# Patient Record
Sex: Female | Born: 1980 | Race: Black or African American | Hispanic: No | Marital: Single | Smoking: Never smoker
Health system: Southern US, Community
[De-identification: ages and names within clinical notes are randomized; demographics above are authoritative.]

## PROBLEM LIST (undated history)

## (undated) DIAGNOSIS — F419 Anxiety disorder, unspecified: Secondary | ICD-10-CM

## (undated) DIAGNOSIS — G43909 Migraine, unspecified, not intractable, without status migrainosus: Secondary | ICD-10-CM

## (undated) HISTORY — DX: Anxiety disorder, unspecified: F41.9

---

## 2017-03-12 ENCOUNTER — Encounter (HOSPITAL_COMMUNITY): Payer: Self-pay | Admitting: Emergency Medicine

## 2017-03-12 ENCOUNTER — Ambulatory Visit (HOSPITAL_COMMUNITY)
Admission: EM | Admit: 2017-03-12 | Discharge: 2017-03-12 | Disposition: A | Discharge status: Home / Self Care | Payer: Self-pay | Attending: Internal Medicine | Admitting: Internal Medicine

## 2017-03-12 DIAGNOSIS — K047 Periapical abscess without sinus: Secondary | ICD-10-CM

## 2017-03-12 DIAGNOSIS — K0889 Other specified disorders of teeth and supporting structures: Secondary | ICD-10-CM

## 2017-03-12 MED ORDER — HYDROCODONE-ACETAMINOPHEN 5-325 MG PO TABS: 1.0000 | ORAL_TABLET | ORAL | 0 refills | Status: DC | PRN

## 2017-03-12 MED ORDER — AMOXICILLIN 500 MG PO CAPS: 1000.0000 mg | ORAL_CAPSULE | Freq: Two times a day (BID) | ORAL | 0 refills | Status: DC

## 2017-03-12 NOTE — ED Provider Notes (Signed)
CSN: [id number]     Arrival date & time 03/12/17  1841 History   First MD Initiated Contact with Patient 03/12/17 1947     Chief Complaint  Patient presents with  . Dental Pain   (Consider location/radiation/quality/duration/timing/severity/associated sxs/prior Treatment) 36 year old female complaining of toothache to the left upper bicuspid. She states it is formed an abscess.      History reviewed. No pertinent past medical history. History reviewed. No pertinent surgical history. No family history on file. Social History  Substance Use Topics  . Smoking status: Never Smoker  . Smokeless tobacco: Never Used  . Alcohol use Yes   OB History    No data available     Review of Systems  Constitutional: Negative.   HENT: Positive for dental problem.   Neurological: Negative.   All other systems reviewed and are negative.   Allergies  Patient has no known allergies.  Home Medications   Prior to Admission medications   Medication Sig Start Date End Date Taking? Authorizing Provider  amoxicillin (AMOXIL) 500 MG capsule Take 2 capsules (1,000 mg total) by mouth 2 (two) times daily. 03/12/17   Hayden Rasmussen, NP  HYDROcodone-acetaminophen (NORCO/VICODIN) 5-325 MG tablet Take 1 tablet by mouth every 4 (four) hours as needed. 03/12/17   Hayden Rasmussen, NP   Meds Ordered and Administered this Visit  Medications - No data to display  BP 103/67   Pulse 76   Temp 98.2 F (36.8 C) (Oral)   Resp 16   Ht 5\' 5"  (1.651 m)   Wt 212 lb (96.2 kg)   LMP 02/13/2017   SpO2 100%   BMI 35.28 kg/m  No data found.   Physical Exam  Constitutional: She is oriented to person, place, and time. She appears well-developed and well-nourished. No distress.  HENT:  At the time of examining the involved tooth and abscess the patient uses her index finger and retracts the upper lip and cheek. During this maneuver the abscess over the gingiva of the left bicuspid spontaneously ruptures and copious  amount of pus drains from this abscess. There is erythema along the gingiva. The  obvious abscess above. Positive dental tenderness.   Eyes: EOM are normal.  Neck: Neck supple.  Cardiovascular: Normal rate.   Neurological: She is oriented to person, place, and time.  Skin: Skin is warm and dry.  Psychiatric: She has a normal mood and affect.  Nursing note and vitals reviewed.   Urgent Care Course     Procedures (including critical care time)  Labs Review Labs Reviewed - No data to display  Imaging Review No results found.   Visual Acuity Review  Right Eye Distance:   Left Eye Distance:   Bilateral Distance:    Right Eye Near:   Left Eye Near:    Bilateral Near:         MDM   1. Pain, dental   2. Dental abscess    Frequent mouth rinses with warm salty water. Take the medication as directed. May also take ibuprofen 600 mg every 6 hours as needed. Do not take more than this. Meds ordered this encounter  Medications  . amoxicillin (AMOXIL) 500 MG capsule    Sig: Take 2 capsules (1,000 mg total) by mouth 2 (two) times daily.    Dispense:  40 capsule    Refill:  0    Order Specific Question:   Supervising Provider    Answer:   Eustace Moore [[id number]]  .  HYDROcodone-acetaminophen (NORCO/VICODIN) 5-325 MG tablet    Sig: Take 1 tablet by mouth every 4 (four) hours as needed.    Dispense:  15 tablet    Refill:  0    Order Specific Question:   Supervising Provider    Answer:   Eustace MooreMURRAY, LAURA W [[id number]]       Hayden RasmussenMabe, Jamerica Snavely, NP 03/12/17 2007

## 2017-03-12 NOTE — ED Triage Notes (Addendum)
PT has left upper dental abscess. Long term problem with affected tooth. Swelling for 1 week. Swelling got significantly worse within last 24 hours. PT took 2 prescription strength ibuprofen at 1600

## 2017-03-12 NOTE — Discharge Instructions (Signed)
Frequent mouth rinses with warm salty water. Take the medication as directed. May also take ibuprofen 600 mg every 6 hours as needed. Do not take more than this.

## 2017-11-09 ENCOUNTER — Encounter (HOSPITAL_COMMUNITY): Payer: Self-pay | Admitting: *Deleted

## 2017-11-09 ENCOUNTER — Other Ambulatory Visit: Payer: Self-pay

## 2017-11-09 ENCOUNTER — Emergency Department (HOSPITAL_COMMUNITY)
Admission: EM | Admit: 2017-11-09 | Discharge: 2017-11-10 | Disposition: A | Discharge status: Home / Self Care | Payer: Self-pay | Attending: Emergency Medicine | Admitting: Emergency Medicine

## 2017-11-09 DIAGNOSIS — G43009 Migraine without aura, not intractable, without status migrainosus: Secondary | ICD-10-CM

## 2017-11-09 DIAGNOSIS — G43909 Migraine, unspecified, not intractable, without status migrainosus: Secondary | ICD-10-CM | POA: Insufficient documentation

## 2017-11-09 MED ORDER — KETOROLAC TROMETHAMINE 15 MG/ML IJ SOLN
15.0000 mg | Freq: Once | INTRAMUSCULAR | Status: AC
  Administered 2017-11-09: 15 mg via INTRAVENOUS
  Filled 2017-11-09: qty 1

## 2017-11-09 MED ORDER — DIPHENHYDRAMINE HCL 50 MG/ML IJ SOLN
12.5000 mg | Freq: Once | INTRAMUSCULAR | Status: AC
  Administered 2017-11-09: 12.5 mg via INTRAVENOUS
  Filled 2017-11-09: qty 1

## 2017-11-09 MED ORDER — METOCLOPRAMIDE HCL 5 MG/ML IJ SOLN
10.0000 mg | Freq: Once | INTRAMUSCULAR | Status: AC
  Administered 2017-11-09: 10 mg via INTRAVENOUS
  Filled 2017-11-09: qty 2

## 2017-11-09 NOTE — ED Triage Notes (Signed)
Headache since Monday with nausea no vomiting or diarrhea.   She has taken tylenol and I has helped. A little while  Last time she took tylenol was 1200 today.  She is in her mid-therm exams  lmp feb 21st

## 2017-11-09 NOTE — ED Provider Notes (Signed)
[facility] EMERGENCY DEPARTMENT Provider Note   CSN: [id number] Arrival date & time: 11/09/17  1924     History   Chief Complaint Chief Complaint  Patient presents with  . Headache    HPI Teresa Carpenter is a 37 y.o. female.  Patient presents with headache that is frontal bilaterally. Symptoms started about 6 days ago and have been intermittent, becoming constant in the last 2-3 days. No fever, congestion, sinus pressure or sore throat. She endorses photophobia without visual changes. She has a history of migraine with similar pattern but states this headache is more persistent which is different. She denies weakness, numbness, difficulty speaking or walking. She has taken Tylenol with limited relief.    The history is provided by the patient. No language interpreter was used.  Headache   Associated symptoms include nausea. Pertinent negatives include no fever and no vomiting.    History reviewed. No pertinent past medical history.  There are no active problems to display for this patient.   History reviewed. No pertinent surgical history.  OB History    No data available       Home Medications    Prior to Admission medications   Medication Sig Start Date End Date Taking? Authorizing Provider  amoxicillin (AMOXIL) 500 MG capsule Take 2 capsules (1,000 mg total) by mouth 2 (two) times daily. 03/12/17   Hayden Rasmussen, NP  HYDROcodone-acetaminophen (NORCO/VICODIN) 5-325 MG tablet Take 1 tablet by mouth every 4 (four) hours as needed. 03/12/17   Hayden Rasmussen, NP    Family History No family history on file.  Social History Social History   Tobacco Use  . Smoking status: Never Smoker  . Smokeless tobacco: Never Used  Substance Use Topics  . Alcohol use: Yes  . Drug use: No     Allergies   Patient has no known allergies.   Review of Systems Review of Systems  Constitutional: Negative for chills and fever.  HENT: Negative.  Negative for  congestion, sinus pressure and sore throat.   Respiratory: Negative.  Negative for cough.   Cardiovascular: Negative.   Gastrointestinal: Positive for nausea. Negative for vomiting.  Musculoskeletal: Negative.   Skin: Negative.   Neurological: Positive for headaches. Negative for facial asymmetry, speech difficulty, light-headedness and numbness.     Physical Exam Updated Vital Signs BP 101/65   Pulse 78   Temp 98.7 F (37.1 C) (Oral)   Resp 15   Ht 5\' 5"  (1.651 m)   Wt 104.3 kg (230 lb)   LMP 10/16/2017   SpO2 100%   BMI 38.27 kg/m   Physical Exam  Constitutional: She appears well-developed and well-nourished.  HENT:  Head: Normocephalic.  Neck: Normal range of motion. Neck supple.  Cardiovascular: Normal rate and regular rhythm.  Pulmonary/Chest: Effort normal and breath sounds normal.  Abdominal: Soft. Bowel sounds are normal. There is no tenderness. There is no rebound and no guarding.  Musculoskeletal: Normal range of motion.  Neurological: She is alert. She has normal strength. She displays normal reflexes. Coordination normal. GCS eye subscore is 4. GCS verbal subscore is 5. GCS motor subscore is 6.  CN's 3-12 grossly intact. Speech is clear and focused. No facial asymmetry. No lateralizing weakness. Reflexes are equal. No deficits of coordination. Ambulatory without imbalance.    Skin: Skin is warm and dry. No rash noted.  Psychiatric: She has a normal mood and affect.     ED Treatments / Results  Labs (all labs ordered are  listed, but only abnormal results are displayed) Labs Reviewed - No data to display  EKG  EKG Interpretation None       Radiology No results found.  Procedures Procedures (including critical care time)  Medications Ordered in ED Medications  metoCLOPramide (REGLAN) injection 10 mg (10 mg Intravenous Given 11/09/17 2323)  diphenhydrAMINE (BENADRYL) injection 12.5 mg (12.5 mg Intravenous Given 11/09/17 2323)  ketorolac (TORADOL)  15 MG/ML injection 15 mg (15 mg Intravenous Given 11/09/17 2323)     Initial Impression / Assessment and Plan / ED Course  I have reviewed the triage vital signs and the nursing notes.  Pertinent labs & imaging results that were available during my care of the patient were reviewed by me and considered in my medical decision making (see chart for details).     Patient presents with symptoms of migraine headache similar to previous headaches. No neurologic deficits. Will give headache cocktail with Toradol and re-examine.  12:10 - recheck finds the patient asymptomatic stating her headache is resolved. Discussed use of Imitrex, will provide Rx. She is felt appropriate for discharge home.   Final Clinical Impressions(s) / ED Diagnoses   Final diagnoses:  None   1. Migraine headache  ED Discharge Orders    None       Elpidio AnisUpstill, Celise Bazar, Cordelia Poche-C 11/10/17 0011    Blane OharaZavitz, Joshua, MD 11/10/17 [phone]

## 2017-11-10 MED ORDER — SUMATRIPTAN SUCCINATE 100 MG PO TABS: ORAL_TABLET | ORAL | 0 refills | Status: DC

## 2018-01-17 ENCOUNTER — Encounter (HOSPITAL_COMMUNITY): Payer: Self-pay

## 2018-01-17 ENCOUNTER — Emergency Department (HOSPITAL_COMMUNITY): Payer: No Typology Code available for payment source

## 2018-01-17 ENCOUNTER — Other Ambulatory Visit: Payer: Self-pay

## 2018-01-17 ENCOUNTER — Emergency Department (HOSPITAL_COMMUNITY)
Admission: EM | Admit: 2018-01-17 | Discharge: 2018-01-17 | Disposition: A | Discharge status: Home / Self Care | Payer: No Typology Code available for payment source | Attending: Emergency Medicine | Admitting: Emergency Medicine

## 2018-01-17 DIAGNOSIS — Z79899 Other long term (current) drug therapy: Secondary | ICD-10-CM | POA: Insufficient documentation

## 2018-01-17 DIAGNOSIS — M79601 Pain in right arm: Secondary | ICD-10-CM | POA: Diagnosis present

## 2018-01-17 NOTE — Discharge Instructions (Signed)
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
## 2018-01-17 NOTE — ED Notes (Signed)
Bed: WTR5 Expected date:  Expected time:  Means of arrival:  Comments: 
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
## 2019-06-09 ENCOUNTER — Encounter: Payer: Self-pay | Admitting: Emergency Medicine

## 2019-06-09 ENCOUNTER — Ambulatory Visit
Admission: EM | Admit: 2019-06-09 | Discharge: 2019-06-09 | Disposition: A | Discharge status: Home / Self Care | Payer: BLUE CROSS/BLUE SHIELD | Attending: Emergency Medicine | Admitting: Emergency Medicine

## 2019-06-09 ENCOUNTER — Other Ambulatory Visit: Payer: Self-pay

## 2019-06-09 DIAGNOSIS — J069 Acute upper respiratory infection, unspecified: Secondary | ICD-10-CM

## 2019-06-09 DIAGNOSIS — J029 Acute pharyngitis, unspecified: Secondary | ICD-10-CM

## 2019-06-09 DIAGNOSIS — R05 Cough: Secondary | ICD-10-CM

## 2019-06-09 DIAGNOSIS — Z20828 Contact with and (suspected) exposure to other viral communicable diseases: Secondary | ICD-10-CM

## 2019-06-09 MED ORDER — LORATADINE 10 MG PO TABS: 10.0000 mg | ORAL_TABLET | Freq: Every day | ORAL | 0 refills | Status: DC

## 2019-06-09 MED ORDER — BENZONATATE 100 MG PO CAPS: 100.0000 mg | ORAL_CAPSULE | Freq: Three times a day (TID) | ORAL | 0 refills | Status: DC

## 2019-06-09 MED ORDER — LIDOCAINE VISCOUS HCL 2 % MT SOLN: 15.0000 mL | OROMUCOSAL | 0 refills | Status: DC | PRN

## 2019-06-09 MED ORDER — FLUTICASONE PROPIONATE 50 MCG/ACT NA SUSP: 1.0000 | Freq: Every day | NASAL | 0 refills | Status: DC

## 2019-06-09 NOTE — ED Provider Notes (Signed)
[facility]    CSN: [id number] Arrival date & time: 06/09/19  0949      History   Chief Complaint Chief Complaint  Patient presents with  . COVID-Like Symptoms    HPI Jakaylah Schlafer is a 38 y.o. female presenting for 3-day course of mild, nonproductive, non-hemoptic cough, diarrhea without blood or melena, generalized body aches, chills, sore throat, fatigue.  Patient does not endorse subjective fever: T-max 99.9F.  Patient is not currently taking anything for her symptoms.  Patient does endorse history of seasonal allergies: No medications currently.  Denies history of asthma, COPD.  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  Exposure was last week, positive test was 2 days ago.  History reviewed. No pertinent past medical history.  There are no active problems to display for this patient.   History reviewed. No pertinent surgical history.  OB History   No obstetric history on file.      Home Medications    Prior to Admission medications   Medication Sig Start Date End Date Taking? Authorizing Provider  benzonatate (TESSALON) 100 MG capsule Take 1 capsule (100 mg total) by mouth every 8 (eight) hours. 06/09/19   Hall-Potvin, [place], PA-C  fluticasone (FLONASE) 50 MCG/ACT nasal spray Place 1 spray into both nostrils daily. 06/09/19   Hall-Potvin, [place], PA-C  lidocaine (XYLOCAINE) 2 % solution Use as directed 15 mLs in the mouth or throat as needed for mouth pain. 06/09/19   Hall-Potvin, [place], PA-C  loratadine (CLARITIN) 10 MG tablet Take 1 tablet (10 mg total) by mouth daily. 06/09/19   Hall-Potvin, [place], PA-C  SUMAtriptan (IMITREX) 100 MG tablet Take one at the onset of headache. You can take a second dose in 2 hours if headache persists. Maximum dose per day is 200 mg (2 tablets) 11/10/17   Elpidio Anis, PA-C    Family History History reviewed. No pertinent family history.  Social History Social History   Tobacco Use  . Smoking status: Never Smoker  . Smokeless tobacco: Never Used  Substance Use Topics  . Alcohol use: Yes  . Drug use: No     Allergies   Patient has no known allergies.   Review of Systems Review of Systems  Constitutional: Positive for appetite change, chills and fatigue. Negative for fever.  HENT: Positive for congestion and sore throat. Negative for ear pain, sinus pain and voice change.   Eyes: Negative for pain, redness and visual disturbance.  Respiratory: Positive for cough. Negative for shortness of breath and wheezing.   Cardiovascular: Negative for chest pain and palpitations.  Gastrointestinal: Positive for diarrhea. Negative for abdominal distention, abdominal pain, blood in stool, nausea, rectal pain and vomiting.       2 loose stools yesterday  Musculoskeletal: Positive for myalgias. Negative for arthralgias, gait problem, neck pain and neck stiffness.  Skin: Negative for rash and wound.  Neurological: Negative for syncope and headaches.     Physical Exam Triage Vital Signs ED Triage Vitals  Enc Vitals Group     BP 06/09/19 1007 (!) 124/91     Pulse Rate 06/09/19 1007 72     Resp 06/09/19 1007 18     Temp 06/09/19 1007 97.9 F (36.6 C)     Temp Source 06/09/19 1007 Temporal     SpO2 06/09/19 1007 98 %     Weight --      Height --  Head Circumference --      Peak Flow --      Pain Score 06/09/19 1006 8     Pain Loc --      Pain Edu? --      Excl. in [place]? --    No data found.  Updated Vital Signs BP (!) 124/91 (BP Location: Left Arm)   Pulse 72   Temp 97.9 F (36.6 C) (Temporal)   Resp 18   LMP 06/04/2019   SpO2 98%    Physical Exam Constitutional:      General: She is not in acute distress.    Appearance: She is obese. She is not toxic-appearing.  HENT:     Head: Normocephalic and atraumatic.     Jaw: There is normal jaw occlusion. No tenderness or pain on movement.     Right Ear:  Hearing, tympanic membrane, ear canal and external ear normal. No tenderness. No mastoid tenderness.     Left Ear: Hearing, tympanic membrane, ear canal and external ear normal. No tenderness. No mastoid tenderness.     Nose: No nasal deformity, septal deviation or nasal tenderness.     Right Turbinates: Not swollen or pale.     Left Turbinates: Not swollen or pale.     Right Sinus: No maxillary sinus tenderness or frontal sinus tenderness.     Left Sinus: No maxillary sinus tenderness or frontal sinus tenderness.     Comments: Bilateral turbinate edema with mucosal injection    Mouth/Throat:     Lips: Pink. No lesions.     Mouth: Mucous membranes are moist. No injury.     Pharynx: Oropharynx is clear. Uvula midline. No posterior oropharyngeal erythema or uvula swelling.     Comments: no tonsillar exudate or hypertrophy Eyes:     General: No scleral icterus.    Conjunctiva/sclera: Conjunctivae normal.     Pupils: Pupils are equal, round, and reactive to light.  Neck:     Musculoskeletal: Normal range of motion and neck supple. No muscular tenderness.  Cardiovascular:     Rate and Rhythm: Normal rate and regular rhythm.     Heart sounds: No murmur. No gallop.   Pulmonary:     Effort: Pulmonary effort is normal. No respiratory distress.     Breath sounds: No wheezing.     Comments: Good air entry bilaterally Abdominal:     General: Bowel sounds are normal.     Tenderness: There is no abdominal tenderness.  Musculoskeletal:        General: No tenderness.     Right lower leg: No edema.     Left lower leg: No edema.  Lymphadenopathy:     Cervical: No cervical adenopathy.  Skin:    General: Skin is warm.     Capillary Refill: Capillary refill takes less than 2 seconds.     Coloration: Skin is not jaundiced.     Findings: No rash.  Neurological:     General: No focal deficit present.     Mental Status: She is alert and oriented to person, place, and time.      UC Treatments /  Results  Labs (all labs ordered are listed, but only abnormal results are displayed) Labs Reviewed  NOVEL CORONAVIRUS, NAA    EKG   Radiology No results found.  Procedures Procedures (including critical care time)  Medications Ordered in UC Medications - No data to display  Initial Impression / Assessment and Plan / UC Course  I have  reviewed the triage vital signs and the nursing notes.  Pertinent labs & imaging results that were available during my care of the patient were reviewed by me and considered in my medical decision making (see chart for details).     Patient high risk given line of work: COVID test pending, patient quarantine.  If positive, this would classify as mild illness currently.  Low concern for strep: Rapid deferred.  Will treat supportively as outlined below.  Return precautions discussed, patient verbalized understanding and is agreeable to plan. Final Clinical Impressions(s) / UC Diagnoses   Final diagnoses:  Sore throat  Viral URI with cough     Discharge Instructions     Your COVID test is pending: Is important you quarantine at home until your results are back. You may take OTC Tylenol for fever and myalgias.  It is important to drink plenty of water throughout the day to stay hydrated. If you test positive and later develop severe fever, cough, or shortness of breath, it is recommended that you go to the ER for further evaluation.    ED Prescriptions    Medication Sig Dispense Auth. Provider   fluticasone (FLONASE) 50 MCG/ACT nasal spray Place 1 spray into both nostrils daily. 16 g Hall-Potvin, [place], PA-C   loratadine (CLARITIN) 10 MG tablet Take 1 tablet (10 mg total) by mouth daily. 30 tablet Hall-Potvin, [place], PA-C   benzonatate (TESSALON) 100 MG capsule Take 1 capsule (100 mg total) by mouth every 8 (eight) hours. 21 capsule Hall-Potvin, [place], PA-C   lidocaine (XYLOCAINE) 2 % solution Use as directed 15 mLs in the mouth or  throat as needed for mouth pain. 100 mL Hall-Potvin, [place], PA-C     PDMP not reviewed this encounter.   Hall-Potvin, [place], [place]-C 06/09/19 1145

## 2019-06-09 NOTE — ED Notes (Signed)
Patient able to ambulate independently  

## 2019-06-09 NOTE — ED Triage Notes (Signed)
Pt presents to [facility] for 3 days of very mild, non-productive cough, diarrhea, body aches, chills, sore throat, fatiugue.

## 2019-06-09 NOTE — Discharge Instructions (Signed)
Your COVID test is pending: Is important you quarantine at home until your results are back. °You may take OTC Tylenol for fever and myalgias.  It is important to drink plenty of water throughout the day to stay hydrated. °If you test positive and later develop severe fever, cough, or shortness of breath, it is recommended that you go to the ER for further evaluation. °

## 2019-06-11 LAB — NOVEL CORONAVIRUS, NAA: SARS-CoV-2, NAA: NOT DETECTED

## 2019-10-13 ENCOUNTER — Ambulatory Visit
Admission: EM | Admit: 2019-10-13 | Discharge: 2019-10-13 | Disposition: A | Discharge status: Home / Self Care | Payer: 59 | Attending: Emergency Medicine | Admitting: Emergency Medicine

## 2019-10-13 DIAGNOSIS — R05 Cough: Secondary | ICD-10-CM | POA: Diagnosis not present

## 2019-10-13 DIAGNOSIS — R519 Headache, unspecified: Secondary | ICD-10-CM | POA: Diagnosis not present

## 2019-10-13 DIAGNOSIS — U071 COVID-19: Secondary | ICD-10-CM

## 2019-10-13 DIAGNOSIS — R059 Cough, unspecified: Secondary | ICD-10-CM

## 2019-10-13 MED ORDER — ONDANSETRON 4 MG PO TBDP
4.0000 mg | ORAL_TABLET | Freq: Once | ORAL | Status: AC
  Administered 2019-10-13: 4 mg via ORAL

## 2019-10-13 MED ORDER — PREDNISONE 20 MG PO TABS: 40.0000 mg | ORAL_TABLET | Freq: Every day | ORAL | 0 refills | Status: AC

## 2019-10-13 MED ORDER — ONDANSETRON HCL 4 MG PO TABS: 4.0000 mg | ORAL_TABLET | Freq: Four times a day (QID) | ORAL | 0 refills | Status: DC

## 2019-10-13 MED ORDER — DEXAMETHASONE SODIUM PHOSPHATE 10 MG/ML IJ SOLN
10.0000 mg | Freq: Once | INTRAMUSCULAR | Status: AC
  Administered 2019-10-13: 10 mg via INTRAMUSCULAR

## 2019-10-13 MED ORDER — KETOROLAC TROMETHAMINE 60 MG/2ML IM SOLN
60.0000 mg | Freq: Once | INTRAMUSCULAR | Status: AC
  Administered 2019-10-13: 60 mg via INTRAMUSCULAR

## 2019-10-13 NOTE — ED Triage Notes (Signed)
Pt states covid positive on 2/09. States still having headaches, dizziness, fatigue, cough and loss of taste and smell.

## 2019-10-13 NOTE — Discharge Instructions (Addendum)
It is very important to remember that since you have tested positive for Covid you need to be staying home and quarantining to help keep others safe and healthy in the community.  If you would like further evaluation for persistent or worsening symptoms, you may schedule an E-visit or virtual (video) visit throughout the [facility] app or website.  PLEASE NOTE: If you develop severe chest pain or shortness of breath please go to the ER or call 9-1-1 for further evaluation --> DO NOT schedule electronic or virtual visits for this. Please call our office for further guidance / recommendations as needed.  For information about the Covid vaccine, please visit [email or web address] 

## 2019-10-13 NOTE — ED Provider Notes (Signed)
[facility]    CSN: [id number] Arrival date & time: 10/13/19  1628      History   Chief Complaint Chief Complaint  Patient presents with  . Dizziness    HPI Teresa Carpenter is a 39 y.o. female with history of COVID-19 infection presenting for evaluation of symptoms.  Tested positive at [facility] on 2/10 after developing symptoms 2/9.  Patient endorsing generalized headaches, dizziness described as "feeling off ", fatigue, sore throat, dry cough, loss of taste and smell.  Patient has had intermittent nausea without vomiting.  Able to keep down fluids, though does have decreased appetite.  Taking TheraFlu, Alka-Seltzer, multivitamin, zinc, vitamin C.  Patient denying chest pain, palpitations, shortness of breath, diarrhea or lower extreme edema, claudication   History reviewed. No pertinent past medical history.  There are no problems to display for this patient.   History reviewed. No pertinent surgical history.  OB History   No obstetric history on file.      Home Medications    Prior to Admission medications   Medication Sig Start Date End Date Taking? Authorizing Provider  ondansetron (ZOFRAN) 4 MG tablet Take 1 tablet (4 mg total) by mouth every 6 (six) hours. 10/13/19   Hall-Potvin, [place], PA-C  predniSONE (DELTASONE) 20 MG tablet Take 2 tablets (40 mg total) by mouth daily for 5 days. 10/13/19 10/18/19  Hall-Potvin, [place], PA-C    Family History History reviewed. No pertinent family history.  Social History Social History   Tobacco Use  . Smoking status: Never Smoker  . Smokeless tobacco: Never Used  Substance Use Topics  . Alcohol use: Yes  . Drug use: No     Allergies   Patient has no known allergies.   Review of Systems As per HPI   Physical Exam Triage Vital Signs ED Triage Vitals  Enc Vitals Group     BP 10/13/19 1641 121/80     Pulse Rate 10/13/19 1641 97     Resp 10/13/19 1641 18     Temp 10/13/19 1641 98.3 F (36.8 C)      Temp Source 10/13/19 1641 Oral     SpO2 10/13/19 1641 99 %     Weight --      Height --      Head Circumference --      Peak Flow --      Pain Score 10/13/19 1644 0     Pain Loc --      Pain Edu? --      Excl. in [place]? --    No data found.  Updated Vital Signs BP 121/80 (BP Location: Left Arm)   Pulse 97   Temp 98.3 F (36.8 C) (Oral)   Resp 18   LMP 09/27/2019   SpO2 99%   Visual Acuity Right Eye Distance:   Left Eye Distance:   Bilateral Distance:    Right Eye Near:   Left Eye Near:    Bilateral Near:     Physical Exam Constitutional:      General: She is not in acute distress.    Appearance: She is obese. She is ill-appearing. She is not toxic-appearing or diaphoretic.  HENT:     Head: Normocephalic and atraumatic.     Mouth/Throat:     Mouth: Mucous membranes are moist.     Pharynx: Oropharynx is clear. No oropharyngeal exudate or posterior oropharyngeal erythema.  Eyes:     General: No scleral icterus.    Conjunctiva/sclera: Conjunctivae normal.  Pupils: Pupils are equal, round, and reactive to light.  Neck:     Comments: Trachea midline, negative JVD Cardiovascular:     Rate and Rhythm: Normal rate and regular rhythm.     Heart sounds: No murmur. No gallop.   Pulmonary:     Effort: Pulmonary effort is normal. No respiratory distress.     Breath sounds: No wheezing, rhonchi or rales.  Abdominal:     General: Bowel sounds are normal.     Palpations: Abdomen is soft.  Musculoskeletal:     Cervical back: Neck supple. No tenderness.  Lymphadenopathy:     Cervical: No cervical adenopathy.  Skin:    Capillary Refill: Capillary refill takes less than 2 seconds.     Coloration: Skin is not jaundiced or pale.     Findings: No rash.  Neurological:     General: No focal deficit present.     Mental Status: She is alert and oriented to person, place, and time.     Cranial Nerves: No cranial nerve deficit.     Deep Tendon Reflexes: Reflexes normal.      Comments: Negative nystagmus, symptoms not worsened on exam      UC Treatments / Results  Labs (all labs ordered are listed, but only abnormal results are displayed) Labs Reviewed - No data to display  EKG   Radiology No results found.  Procedures Procedures (including critical care time)  Medications Ordered in UC Medications  ketorolac (TORADOL) injection 60 mg (60 mg Intramuscular Given 10/13/19 1724)  dexamethasone (DECADRON) injection 10 mg (10 mg Intramuscular Given 10/13/19 1724)  ondansetron (ZOFRAN-ODT) disintegrating tablet 4 mg (4 mg Oral Given 10/13/19 1723)    Initial Impression / Assessment and Plan / UC Course  I have reviewed the triage vital signs and the nursing notes.  Pertinent labs & imaging results that were available during my care of the patient were reviewed by me and considered in my medical decision making (see chart for details).     Patient afebrile, nontoxic, though does appear fatigued/ill.  No acute respiratory distress, patient appears well-hydrated.  Patient complaining of headache that is without alarm symptoms such as worse headache of life, declined headache, visual or auditory changes.  Given IM Toradol, Decadron, Zofran ODT which he tolerated well.  Will treat supportively as outlined below.  Return precautions discussed, patient verbalized understanding and is agreeable to plan. Final Clinical Impressions(s) / UC Diagnoses   Final diagnoses:  COVID-19 virus infection  Cough  Nonintractable headache, unspecified chronicity pattern, unspecified headache type     Discharge Instructions     It is very important to remember that since you have tested positive for Covid you need to be staying home and quarantining to help keep others safe and healthy in the community.  If you would like further evaluation for persistent or worsening symptoms, you may schedule an E-visit or virtual (video) visit throughout the [facility] app or  website.  PLEASE NOTE: If you develop severe chest pain or shortness of breath please go to the ER or call 9-1-1 for further evaluation --> DO NOT schedule electronic or virtual visits for this. Please call our office for further guidance / recommendations as needed.  For information about the Covid vaccine, please visit [email or web address]    ED Prescriptions    Medication Sig Dispense Auth. Provider   predniSONE (DELTASONE) 20 MG tablet Take 2 tablets (40 mg total) by mouth daily for 5 days. 10 tablet Hall-Potvin,  [place], PA-C   ondansetron (ZOFRAN) 4 MG tablet Take 1 tablet (4 mg total) by mouth every 6 (six) hours. 12 tablet Hall-Potvin, [place], PA-C     PDMP not reviewed this encounter.   Hall-Potvin, [place], [place] 10/14/19 1006

## 2019-10-28 ENCOUNTER — Encounter: Payer: Self-pay | Admitting: Family Medicine

## 2019-10-28 ENCOUNTER — Telehealth (INDEPENDENT_AMBULATORY_CARE_PROVIDER_SITE_OTHER): Payer: 59 | Admitting: Family Medicine

## 2019-10-28 ENCOUNTER — Ambulatory Visit (INDEPENDENT_AMBULATORY_CARE_PROVIDER_SITE_OTHER): Payer: 59

## 2019-10-28 ENCOUNTER — Other Ambulatory Visit: Payer: 59

## 2019-10-28 ENCOUNTER — Other Ambulatory Visit: Payer: Self-pay

## 2019-10-28 VITALS — BP 122/63 | HR 87 | Temp 98.2°F | Ht 65.0 in | Wt 235.0 lb

## 2019-10-28 DIAGNOSIS — U071 COVID-19: Secondary | ICD-10-CM

## 2019-10-28 DIAGNOSIS — G43809 Other migraine, not intractable, without status migrainosus: Secondary | ICD-10-CM | POA: Diagnosis not present

## 2019-10-28 DIAGNOSIS — Z7689 Persons encountering health services in other specified circumstances: Secondary | ICD-10-CM

## 2019-10-28 DIAGNOSIS — R05 Cough: Secondary | ICD-10-CM

## 2019-10-28 DIAGNOSIS — R053 Chronic cough: Secondary | ICD-10-CM

## 2019-10-28 MED ORDER — PREDNISONE 20 MG PO TABS: ORAL_TABLET | ORAL | 0 refills | Status: DC

## 2019-10-28 MED ORDER — HYDROCOD POLST-CPM POLST ER 10-8 MG/5ML PO SUER: 5.0000 mL | Freq: Every evening | ORAL | 0 refills | Status: DC | PRN

## 2019-10-28 MED ORDER — BENZONATATE 100 MG PO CAPS: 100.0000 mg | ORAL_CAPSULE | Freq: Two times a day (BID) | ORAL | 1 refills | Status: DC | PRN

## 2019-10-28 MED ORDER — SUMATRIPTAN SUCCINATE 50 MG PO TABS: ORAL_TABLET | ORAL | 0 refills | Status: DC

## 2019-10-28 NOTE — Addendum Note (Signed)
Addended by: Varney Biles on: 10/28/2019 10:49 AM   Modules accepted: Orders

## 2019-10-28 NOTE — Progress Notes (Signed)
Virtual Visit via Video Note  I connected with Teresa Carpenter on 10/28/19 at  8:30 AM EST by a video enabled telemedicine application and verified that I am speaking with the correct person using two identifiers. Location patient: home Location provider: home office Persons participating in the virtual visit: patient, provider  I discussed the limitations of evaluation and management by telemedicine and the availability of in person appointments. The patient expressed understanding and agreed to proceed.  Chief Complaint  Patient presents with  . Establish Care    Follow up/postive covid test, 10/05/19.  Pt c/o nausea and vomiting, still coming and going and fatigue.  Pt still has a cough and sore throat.     HPI: Teresa Carpenter is a 39 y.o. female to establish care with our office. 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 Pt tested positive for COVID on 10/05/19. All 6 of her sons tested positive as well. Pt is still experiencing fatigue as well as cough, sore throat, daily headaches, intermittent lightheadedness. She has tried multiple OTC meds without significant relief. Cough is deep and keeps pt up at night. No fever, chills. No SOB. Pt has been out of work and was told to f/u with PCP to be evaluated and cleared to return to work.    History reviewed. No pertinent past medical history.  History reviewed. No pertinent surgical history.  Family History  Problem Relation Age of Onset  . Supraventricular tachycardia Mother   . Hypercholesterolemia Mother   . Cancer Father   . Hypertension Father   . Congestive Heart Failure Father     Social History   Tobacco Use  . Smoking status: Never Smoker  . Smokeless tobacco: Never Used  Substance Use Topics  . Alcohol use: Yes  . Drug use: No    No current outpatient medications on file.  No Known Allergies    ROS: See pertinent positives and negatives per HPI.   EXAM:  VITALS per patient if  applicable: BP 122/63 (BP Location: Left Wrist, Patient Position: Sitting, Cuff Size: Normal)   Pulse 87   Temp 98.2 F (36.8 C) (Oral)   Ht 5\' 5"  (1.651 m)   Wt 235 lb (106.6 kg)   LMP 10/26/2019   SpO2 99%   BMI 39.11 kg/m    GENERAL: alert, oriented, appears fatigued but in no acute distress  HEENT: atraumatic, conjunctiva clear, no obvious abnormalities on inspection of external nose and ears  NECK: normal movements of the head and neck  LUNGS: on inspection no signs of respiratory distress, breathing rate appears normal, no obvious gross SOB, gasping or wheezing, no conversational dyspnea  CV: no obvious cyanosis  PSYCH/NEURO: pleasant and cooperative, no obvious depression or anxiety, speech and thought processing grossly intact   ASSESSMENT AND PLAN:  1. Encounter to establish care with new doctor  2. COVID-19 virus infection 3. Persistent cough - DG Chest 2 View; Future Rx: - predniSONE (DELTASONE) 20 MG tablet; 3 tabs po x 3 days, then 2 tabs po x 3 days, then 1 tab po x 3 days, then 1/2 tab po x 3 days  Dispense: 20 tablet; Refill: 0 - benzonatate (TESSALON) 100 MG capsule; Take 1 capsule (100 mg total) by mouth 2 (two) times daily as needed for cough.  Dispense: 30 capsule; Refill: 1 - chlorpheniramine-HYDROcodone (TUSSIONEX PENNKINETIC ER) 10-8 MG/5ML SUER; Take 5 mLs by mouth at bedtime as needed for cough.  Dispense: 140 mL; Refill:  0 - f/u in 1 week or sooner PRN  4. Other migraine without status migrainosus, not intractable Rx: - SUMAtriptan (IMITREX) 50 MG tablet; 1 tab po at onset of headache, may repeat 1 tab in 1-2 hrs PRN  Dispense: 10 tablet; Refill: 0   Discussed plan and reviewed medications with patient, including risks, benefits, and potential side effects. Pt expressed understand. All questions answered.    I discussed the assessment and treatment plan with the patient. The patient was provided an opportunity to ask questions and all were  answered. The patient agreed with the plan and demonstrated an understanding of the instructions.   The patient was advised to call back or seek an in-person evaluation if the symptoms worsen or if the condition fails to improve as anticipated.   Letta Median, DO

## 2019-10-28 NOTE — Patient Instructions (Signed)
There are no preventive care reminders to display for this patient.  No flowsheet data found.  

## 2019-11-04 ENCOUNTER — Encounter: Payer: Self-pay | Admitting: Family Medicine

## 2019-11-04 ENCOUNTER — Telehealth (INDEPENDENT_AMBULATORY_CARE_PROVIDER_SITE_OTHER): Payer: 59 | Admitting: Family Medicine

## 2019-11-04 VITALS — BP 119/64 | HR 72 | Temp 98.6°F | Ht 65.0 in | Wt 230.0 lb

## 2019-11-04 DIAGNOSIS — Z8616 Personal history of COVID-19: Secondary | ICD-10-CM | POA: Diagnosis not present

## 2019-11-04 DIAGNOSIS — G43809 Other migraine, not intractable, without status migrainosus: Secondary | ICD-10-CM | POA: Diagnosis not present

## 2019-11-04 MED ORDER — SUMATRIPTAN SUCCINATE 50 MG PO TABS: ORAL_TABLET | ORAL | 0 refills | Status: DC

## 2019-11-04 MED ORDER — TOPIRAMATE 50 MG PO TABS: ORAL_TABLET | ORAL | 1 refills | Status: DC

## 2019-11-04 NOTE — Progress Notes (Signed)
Virtual Visit via Video Note  I connected with Teresa Carpenter on 11/04/19 at 10:00 AM EST by a video enabled telemedicine application and verified that I am speaking with the correct person using two identifiers. Location patient: home Location provider: work  Persons participating in the virtual visit: patient, provider  I discussed the limitations of evaluation and management by telemedicine and the availability of in person appointments. The patient expressed understanding and agreed to proceed.  Chief Complaint  Patient presents with  . Follow-up    Pt c/o still having headaches. Pt has been taking the Sumatriptan which helps temporarity and then headache returns     HPI: Teresa Carpenter is a 39 y.o. female who was diagnosed with COVID in 09/2019. She was last seen by me on 10/28/19 for persistent cough, sore throat, lightheadedness, fatigue. These symptoms have improved/resolved. She is still is having headaches. The sumatriptan helps for 8hrs or so but then returns. She is still having almost daily headaches. She gets nausea with intense headaches and also notes photophobia. Headaches can occur in AM or later in the day.  Pt thinks she can return to work and would like a note saying she can do so tomorrow.   History reviewed. No pertinent past medical history.  History reviewed. No pertinent surgical history.  Family History  Problem Relation Age of Onset  . Supraventricular tachycardia Mother   . Hypercholesterolemia Mother   . Cancer Father   . Hypertension Father   . Congestive Heart Failure Father     Social History   Tobacco Use  . Smoking status: Never Smoker  . Smokeless tobacco: Never Used  Substance Use Topics  . Alcohol use: Yes  . Drug use: No     Current Outpatient Medications:  .  benzonatate (TESSALON) 100 MG capsule, Take 1 capsule (100 mg total) by mouth 2 (two) times daily as needed for cough., Disp: 30 capsule, Rfl: 1 .   chlorpheniramine-HYDROcodone (TUSSIONEX PENNKINETIC ER) 10-8 MG/5ML SUER, Take 5 mLs by mouth at bedtime as needed for cough., Disp: 140 mL, Rfl: 0 .  SUMAtriptan (IMITREX) 50 MG tablet, 1 tab po at onset of headache, may repeat 1 tab in 1-2 hrs PRN, Disp: 10 tablet, Rfl: 0 .  predniSONE (DELTASONE) 20 MG tablet, 3 tabs po x 3 days, then 2 tabs po x 3 days, then 1 tab po x 3 days, then 1/2 tab po x 3 days (Patient not taking: Reported on 11/04/2019), Disp: 20 tablet, Rfl: 0  No Known Allergies    ROS: See pertinent positives and negatives per HPI.   EXAM:  VITALS per patient if applicable: BP 119/64   Pulse 72   Temp 98.6 F (37 C) (Temporal)   Ht 5\' 5"  (1.651 m)   Wt 230 lb (104.3 kg)   LMP 10/26/2019   SpO2 100%   BMI 38.27 kg/m    GENERAL: alert, oriented, in no acute distress  HEENT: atraumatic, conjunctiva clear, no obvious abnormalities on inspection of external nose and ears  NECK: normal movements of the head and neck  LUNGS: on inspection no signs of respiratory distress, breathing rate appears normal, no obvious gross SOB, gasping or wheezing, no conversational dyspnea  CV: no obvious cyanosis  PSYCH/NEURO: pleasant and cooperative, speech and thought processing grossly intact   ASSESSMENT AND PLAN: 1. Personal history of covid-19 2. Other migraine without status migrainosus, not intractable Refill: - SUMAtriptan (IMITREX) 50 MG tablet; 1 tab po at onset of headache,  may repeat 1 tab in 1-2 hrs PRN  Dispense: 10 tablet; Refill: 0 Rx: - topiramate (TOPAMAX) 50 MG tablet; 1/2 tab po qPM x 1 week then increase to 1 tab po qPM  Dispense: 30 tablet; Refill: 1 - Ambulatory referral to Neurology - f/u in 1.5-2 wks or sooner PRN - ok to return to work, note written and send to pt via MyChart Discussed plan and reviewed medications with patient, including risks, benefits, and potential side effects. Pt expressed understand. All questions answered.  I discussed the  assessment and treatment plan with the patient. The patient was provided an opportunity to ask questions and all were answered. The patient agreed with the plan and demonstrated an understanding of the instructions.   The patient was advised to call back or seek an in-person evaluation if the symptoms worsen or if the condition fails to improve as anticipated.   Letta Median, DO

## 2019-11-04 NOTE — Patient Instructions (Signed)
Health Maintenance Due  Topic Date Due  . HIV Screening  Never done  . TETANUS/TDAP  Never done  . PAP SMEAR-Modifier  Never done  . INFLUENZA VACCINE  Never done    Depression screen [facility] 2/9 10/28/2019  Decreased Interest 0  Down, Depressed, Hopeless 0  PHQ - 2 Score 0

## 2019-11-18 ENCOUNTER — Other Ambulatory Visit: Payer: Self-pay | Admitting: Family Medicine

## 2019-11-18 DIAGNOSIS — G43809 Other migraine, not intractable, without status migrainosus: Secondary | ICD-10-CM

## 2019-11-21 ENCOUNTER — Ambulatory Visit
Admission: EM | Admit: 2019-11-21 | Discharge: 2019-11-21 | Disposition: A | Discharge status: Home / Self Care | Payer: 59 | Attending: Family Medicine | Admitting: Family Medicine

## 2019-11-21 ENCOUNTER — Other Ambulatory Visit: Payer: Self-pay

## 2019-11-21 ENCOUNTER — Encounter: Payer: Self-pay | Admitting: Family Medicine

## 2019-11-21 DIAGNOSIS — K029 Dental caries, unspecified: Secondary | ICD-10-CM | POA: Diagnosis not present

## 2019-11-21 HISTORY — DX: Migraine, unspecified, not intractable, without status migrainosus: G43.909

## 2019-11-21 MED ORDER — HYDROCODONE-ACETAMINOPHEN 5-325 MG PO TABS: 1.0000 | ORAL_TABLET | Freq: Four times a day (QID) | ORAL | 0 refills | Status: DC | PRN

## 2019-11-21 MED ORDER — PENICILLIN V POTASSIUM 500 MG PO TABS: 500.0000 mg | ORAL_TABLET | Freq: Three times a day (TID) | ORAL | 0 refills | Status: DC

## 2019-11-21 NOTE — ED Notes (Signed)
Patient able to ambulate independently  

## 2019-11-21 NOTE — ED Triage Notes (Signed)
Pt presents to [facility] for assessment of left lower dental pain x 1 week.  States hx of cavity to this area.

## 2019-11-21 NOTE — ED Provider Notes (Signed)
[facility]    CSN: [id number] Arrival date & time: 11/21/19  0905      History   Chief Complaint Chief Complaint  Patient presents with  . Dental Pain    HPI Teresa Carpenter is a 39 y.o. female.   39 yo established [facility] patient presents with dental pain.  10 days of increasing pain associated with headache.  No fever.     Past Medical History:  Diagnosis Date  . Migraines     There are no problems to display for this patient.   History reviewed. No pertinent surgical history.  OB History   No obstetric history on file.      Home Medications    Prior to Admission medications   Medication Sig Start Date End Date Taking? Authorizing Provider  HYDROcodone-acetaminophen (NORCO) 5-325 MG tablet Take 1 tablet by mouth every 6 (six) hours as needed for moderate pain. 11/21/19   Elvina Sidle, MD  penicillin v potassium (VEETID) 500 MG tablet Take 1 tablet (500 mg total) by mouth 3 (three) times daily. 11/21/19   Elvina Sidle, MD  SUMAtriptan (IMITREX) 50 MG tablet TAKE 1 TAB BY MOUTH AT ONSET OF HEADACHE, MAY REPEAT 1 TAB IN 1-2 HRS AS NEEDED 11/18/19   Cirigliano, Mary K, DO  topiramate (TOPAMAX) 50 MG tablet 1/2 tab po qPM x 1 week then increase to 1 tab po qPM 11/04/19   Cirigliano, Jearld Lesch, DO    Family History Family History  Problem Relation Age of Onset  . Supraventricular tachycardia Mother   . Hypercholesterolemia Mother   . Cancer Father   . Hypertension Father   . Congestive Heart Failure Father     Social History Social History   Tobacco Use  . Smoking status: Never Smoker  . Smokeless tobacco: Never Used  Substance Use Topics  . Alcohol use: Yes  . Drug use: No     Allergies   Patient has no known allergies.   Review of Systems Review of Systems  Constitutional: Negative.   HENT: Positive for dental problem.   Neurological: Positive for headaches.  All other systems reviewed and are negative.    Physical  Exam Triage Vital Signs ED Triage Vitals  Enc Vitals Group     BP      Pulse      Resp      Temp      Temp src      SpO2      Weight      Height      Head Circumference      Peak Flow      Pain Score      Pain Loc      Pain Edu?      Excl. in GC?    No data found.  Updated Vital Signs BP 103/69 (BP Location: Left Arm)   Pulse 79   Temp 97.8 F (36.6 C) (Temporal)   Resp 18   LMP 10/26/2019   SpO2 97%    Physical Exam Constitutional:      General: She is not in acute distress.    Appearance: Normal appearance. She is obese. She is not ill-appearing.  HENT:     Mouth/Throat:     Comments: Dental carie #31 Eyes:     Conjunctiva/sclera: Conjunctivae normal.  Pulmonary:     Effort: Pulmonary effort is normal.  Musculoskeletal:        General: Normal range of motion.  Cervical back: Normal range of motion and neck supple. No tenderness.  Skin:    General: Skin is warm and dry.  Neurological:     General: No focal deficit present.     Mental Status: She is alert.  Psychiatric:        Mood and Affect: Mood normal.        Behavior: Behavior normal.        Thought Content: Thought content normal.        Judgment: Judgment normal.      UC Treatments / Results  Labs (all labs ordered are listed, but only abnormal results are displayed) Labs Reviewed - No data to display  EKG   Radiology No results found.  Procedures Procedures (including critical care time)  Medications Ordered in UC Medications - No data to display  Initial Impression / Assessment and Plan / UC Course  I have reviewed the triage vital signs and the nursing notes.  Pertinent labs & imaging results that were available during my care of the patient were reviewed by me and considered in my medical decision making (see chart for details).    Final Clinical Impressions(s) / UC Diagnoses   Final diagnoses:  Dental caries   Discharge Instructions   None    ED Prescriptions     Medication Sig Dispense Auth. Provider   penicillin v potassium (VEETID) 500 MG tablet Take 1 tablet (500 mg total) by mouth 3 (three) times daily. 30 tablet Robyn Haber, MD   HYDROcodone-acetaminophen (NORCO) 5-325 MG tablet Take 1 tablet by mouth every 6 (six) hours as needed for moderate pain. 12 tablet Robyn Haber, MD     I have reviewed the PDMP during this encounter.   Robyn Haber, MD 11/21/19 [phone]

## 2019-12-13 ENCOUNTER — Other Ambulatory Visit: Payer: Self-pay | Admitting: Family Medicine

## 2019-12-13 DIAGNOSIS — G43809 Other migraine, not intractable, without status migrainosus: Secondary | ICD-10-CM

## 2019-12-13 NOTE — Telephone Encounter (Signed)
Last vv 11/04/19 Last fill 11/18/19  #9/0
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
## 2020-01-15 ENCOUNTER — Other Ambulatory Visit: Payer: Self-pay | Admitting: Family Medicine

## 2020-01-15 DIAGNOSIS — G43809 Other migraine, not intractable, without status migrainosus: Secondary | ICD-10-CM

## 2020-01-18 NOTE — Telephone Encounter (Signed)
Will wait for Dr. Salena Saner to address this. Looks like she refuses to schedule an appt with neurology as recommended by Dr. Salena Saner

## 2020-01-18 NOTE — Telephone Encounter (Signed)
Please see message and advise 

## 2020-01-18 NOTE — Telephone Encounter (Signed)
Last OV 11/04/2019 Last fill for Sumatripton 12/13/19  #10/0 Last fill for Topiramate 11/04/19  #30/1 Please advise.

## 2020-06-01 ENCOUNTER — Encounter: Payer: 59 | Admitting: Family Medicine

## 2020-10-11 ENCOUNTER — Encounter: Payer: 59 | Admitting: Family Medicine

## 2020-10-20 ENCOUNTER — Encounter: Payer: 59 | Admitting: Family Medicine

## 2020-10-26 ENCOUNTER — Other Ambulatory Visit: Payer: Self-pay

## 2020-10-26 ENCOUNTER — Ambulatory Visit (INDEPENDENT_AMBULATORY_CARE_PROVIDER_SITE_OTHER): Payer: 59 | Admitting: Family Medicine

## 2020-10-26 ENCOUNTER — Encounter: Payer: Self-pay | Admitting: Family Medicine

## 2020-10-26 VITALS — BP 120/78 | HR 79 | Temp 97.6°F | Ht 66.0 in | Wt 225.4 lb

## 2020-10-26 DIAGNOSIS — G43809 Other migraine, not intractable, without status migrainosus: Secondary | ICD-10-CM | POA: Diagnosis not present

## 2020-10-26 DIAGNOSIS — Z1159 Encounter for screening for other viral diseases: Secondary | ICD-10-CM | POA: Diagnosis not present

## 2020-10-26 DIAGNOSIS — Z1321 Encounter for screening for nutritional disorder: Secondary | ICD-10-CM

## 2020-10-26 DIAGNOSIS — Z13220 Encounter for screening for lipoid disorders: Secondary | ICD-10-CM | POA: Diagnosis not present

## 2020-10-26 DIAGNOSIS — Z1231 Encounter for screening mammogram for malignant neoplasm of breast: Secondary | ICD-10-CM

## 2020-10-26 DIAGNOSIS — Z0000 Encounter for general adult medical examination without abnormal findings: Secondary | ICD-10-CM

## 2020-10-26 DIAGNOSIS — R002 Palpitations: Secondary | ICD-10-CM

## 2020-10-26 LAB — LIPID PANEL
Cholesterol: 141 mg/dL (ref 0–200)
HDL: 40.7 mg/dL (ref >39.00)
LDL Cholesterol: 88 mg/dL (ref 0–99)
NonHDL: 100.36
Total CHOL/HDL Ratio: 3
Triglycerides: 61 mg/dL (ref 0.0–149.0)
VLDL: 12.2 mg/dL (ref 0.0–40.0)

## 2020-10-26 LAB — BASIC METABOLIC PANEL
BUN: 8 mg/dL (ref 6–23)
CO2: 28 mEq/L (ref 19–32)
Calcium: 8.9 mg/dL (ref 8.4–10.5)
Chloride: 105 mEq/L (ref 96–112)
Creatinine, Ser: 0.7 mg/dL (ref 0.40–1.20)
GFR: 107.77 mL/min (ref >60.00)
Glucose, Bld: 92 mg/dL (ref 70–99)
Potassium: 3.8 mEq/L (ref 3.5–5.1)
Sodium: 138 mEq/L (ref 135–145)

## 2020-10-26 LAB — VITAMIN D 25 HYDROXY (VIT D DEFICIENCY, FRACTURES): VITD: 8.66 ng/mL — ABNORMAL LOW (ref 30.00–100.00)

## 2020-10-26 LAB — ALT: ALT: 8 U/L (ref 0–35)

## 2020-10-26 LAB — CBC
HCT: 28 % — ABNORMAL LOW (ref 36.0–46.0)
Hemoglobin: 9 g/dL — ABNORMAL LOW (ref 12.0–15.0)
MCHC: 32 g/dL (ref 30.0–36.0)
MCV: 67.1 fl — ABNORMAL LOW (ref 78.0–100.0)
Platelets: 268 10*3/uL (ref 150.0–400.0)
RBC: 4.18 Mil/uL (ref 3.87–5.11)
RDW: 18.9 % — ABNORMAL HIGH (ref 11.5–15.5)
WBC: 4.8 10*3/uL (ref 4.0–10.5)

## 2020-10-26 LAB — AST: AST: 12 U/L (ref 0–37)

## 2020-10-26 MED ORDER — SUMATRIPTAN SUCCINATE 50 MG PO TABS: ORAL_TABLET | ORAL | 3 refills | Status: AC

## 2020-10-26 NOTE — Patient Instructions (Addendum)
[facility]  [email or web address] [address] [place] [address] P: [phone] F: [phone] [email or web address]  [facility] [email or web address] [address] [place], [place] [address] fax: [phone] [email or web address]    [facility]: [email or web address]  [facility] [email or web address]  [facility] [email or web address]  [facility] [email or web address]  [email or web address]

## 2020-10-26 NOTE — Progress Notes (Signed)
Teresa Carpenter is a 40 y.o. female  Pt was 9 min late to her appt today.      Chief Complaint  Patient presents with  . Annual Exam    CPE/labs.  C/o having palpitations off/on ?anxiety.     HPI: Teresa Carpenter is a 40 y.o. female seen today for annual CPE, fasting labs.  Last PAP: overdue, no h/o abnormal PAP Last mammo: never and due  Diet/Exercise: recently started exercising, diet is average Dental: has upcoming appt Vision: wears glasses/contacts and UTD on exam  Med refills needed today? imitrex  She has a h/o migraine headaches. She does not take topamax daily and in the past 3 mo she has 1-2 headaches per mo.   Pt has a h/o anxiety and today complains of intermittent episodes of palpitations and she wonders if due to anxiety or other. She describes as a "flutter". Lasts a couple of seconds, worse when she is upset or stressed. Some mild nausea, SOB. All resolve if pts sits and takes deep breaths.  Pt is a single mom of 6 kids (20yo-5yo), pts mom lives here but "is her own stress" GAD-7 = 1 PHQ-9 = 5      Past Medical History:  Diagnosis Date  . Anxiety   . Migraines     History reviewed. No pertinent surgical history.  Social History        Socioeconomic History  . Marital status: Single    Spouse name: Not on file  . Number of children: Not on file  . Years of education: Not on file  . Highest education level: Not on file  Occupational History  . Not on file  Tobacco Use  . Smoking status: Never Smoker  . Smokeless tobacco: Never Used  Vaping Use  . Vaping Use: Never used  Substance and Sexual Activity  . Alcohol use: Yes  . Drug use: No  . Sexual activity: Yes  Other Topics Concern  . Not on file  Social History Narrative  . Not on file   Social Determinants of Health   Financial Resource Strain: Not on file  Food Insecurity: Not on file  Transportation Needs: Not on file  Physical Activity: Not on file   Stress: Not on file  Social Connections: Not on file  Intimate Partner Violence: Not on file         Family History  Problem Relation Age of Onset  . Supraventricular tachycardia Mother   . Hypercholesterolemia Mother   . Cancer Father   . Hypertension Father   . Congestive Heart Failure Father          Immunization History  Administered Date(s) Administered  . Influenza-Unspecified 05/21/2020  . PFIZER(Purple Top)SARS-COV-2 Vaccination 05/21/2020, 06/11/2020        Outpatient Encounter Medications as of 10/26/2020  Medication Sig  . SUMAtriptan (IMITREX) 50 MG tablet TAKE 1 TAB BY MOUTH AT ONSET OF HEADACHE, MAY REPEAT 1 TAB IN 1-2 HOURS AS NEEDED  . topiramate (TOPAMAX) 50 MG tablet Take 1 tablet (50 mg total) by mouth daily. TAKE 1/2 TAB BY MOUTH EVERY EVENING FOR 1 WEEK THEN INCREASE TO 1 TAB BY MOUTH EVERY EVENING  . HYDROcodone-acetaminophen (NORCO) 5-325 MG tablet Take 1 tablet by mouth every 6 (six) hours as needed for moderate pain. (Patient not taking: Reported on 10/26/2020)  . penicillin v potassium (VEETID) 500 MG tablet Take 1 tablet (500 mg total) by mouth 3 (three) times daily. (Patient not taking: Reported on 10/26/2020)  No facility-administered encounter medications on file as of 10/26/2020.     ROS: Gen: no fever, chills  Skin: no rash, itching ENT: no ear pain, ear drainage, nasal congestion, rhinorrhea, sinus pressure, sore throat Eyes: no blurry vision, double vision Resp: no cough, wheeze,SOB Breast: no breast tenderness, no nipple discharge, no breast masses CV: no CP, + palpitations, no LE edema  GI: no heartburn, n/v/d/c, abd pain GU: no dysuria, urgency, frequency, hematuria MSK: no joint pain, myalgias, back pain Neuro: no dizziness, headache, weakness, vertigo Psych: no depression, anxiety, insomnia   No Known Allergies  BP 120/78   Pulse 79   Temp 97.6 F (36.4 C) (Temporal)   Ht 5\' 6"  (1.676 m)   Wt 225 lb 6.4 oz  (102.2 kg)   SpO2 98%   BMI 36.38 kg/m      Wt Readings from Last 3 Encounters:  10/26/20 225 lb 6.4 oz (102.2 kg)  11/04/19 230 lb (104.3 kg)  10/28/19 235 lb (106.6 kg)      Temp Readings from Last 3 Encounters:  10/26/20 97.6 F (36.4 C) (Temporal)  11/21/19 97.8 F (36.6 C) (Temporal)  11/04/19 98.6 F (37 C) (Temporal)      BP Readings from Last 3 Encounters:  10/26/20 120/78  11/21/19 103/69  11/04/19 119/64      Pulse Readings from Last 3 Encounters:  10/26/20 79  11/21/19 79  11/04/19 72     Physical Exam Constitutional:      General: She is not in acute distress.    Appearance: She is well-developed and well-nourished.  HENT:     Head: Normocephalic and atraumatic.     Right Ear: Tympanic membrane and ear canal normal.     Left Ear: Tympanic membrane and ear canal normal.     Nose: Nose normal.     Mouth/Throat:     Mouth: Oropharynx is clear and moist and mucous membranes are normal. Mucous membranes are moist.     Pharynx: Oropharynx is clear.  Eyes:     Conjunctiva/sclera: Conjunctivae normal.  Neck:     Thyroid: No thyromegaly.  Cardiovascular:     Rate and Rhythm: Normal rate and regular rhythm.     Pulses: Intact distal pulses.     Heart sounds: Normal heart sounds. No murmur heard.   Pulmonary:     Effort: Pulmonary effort is normal. No respiratory distress.     Breath sounds: Normal breath sounds. No wheezing or rhonchi.  Abdominal:     General: Bowel sounds are normal. There is no distension.     Palpations: Abdomen is soft. There is no mass.     Tenderness: There is no abdominal tenderness.  Musculoskeletal:        General: No edema.     Cervical back: Neck supple.     Right lower leg: No edema.     Left lower leg: No edema.  Lymphadenopathy:     Cervical: No cervical adenopathy.  Skin:    General: Skin is warm and dry.  Neurological:     Mental Status: She is alert and oriented to person, place, and time.     Motor:  No abnormal muscle tone.     Coordination: Coordination normal.  Psychiatric:        Mood and Affect: Mood and affect and mood normal.        Behavior: Behavior normal.      A/P:  1. Annual physical exam - discussed importance of regular  CV exercise, healthy diet, adequate sleep - due for PAP and will schedule with GYN, referral placed today for mammo - UTD on dental and vision exams - immunizations UTD - ALT - AST - Basic metabolic panel - CBC  2. Other migraine without status migrainosus, not intractable - 1-2 per month Refill: - SUMAtriptan (IMITREX) 50 MG tablet; May repeat in 2 hours if headache persists or recurs.  Dispense: 10 tablet; Refill: 3  3. Screening for lipid disorders - Lipid panel  4. Encounter for hepatitis C screening test for low risk patient - Hepatitis C Antibody  5. Encounter for vitamin deficiency screening - VITAMIN D 25 Hydroxy (Vit-D Deficiency, Fractures)  6. Encounter for screening mammogram for malignant neoplasm of breast - MM DIGITAL SCREENING BILATERAL; Future  7. Palpitations 8. Anxiety - very likely stress, anxiety related and they occur when pt is stressed, upset - referral to [facility] counseling - pt declines Rx med at this time - stressed importance of adequate sleep, proper hydration and nutrition, regular exercise on mental health    This visit occurred during the SARS-CoV-2 public health emergency.  Safety protocols were in place, including screening questions prior to the visit, additional usage of staff PPE, and extensive cleaning of exam room while observing appropriate contact time as indicated for disinfecting solutions.

## 2020-10-26 NOTE — Progress Notes (Deleted)
Teresa Carpenter is a 40 y.o. female  No chief complaint on file.   HPI: Teresa Carpenter is a 40 y.o. female seen today for annual CPE, fasting labs.  Last PAP: *** Last mammo: ***  Diet/Exercise: *** Dental: Vision:  Med refills needed today? ***   Past Medical History:  Diagnosis Date  . Migraines     No past surgical history on file.  Social History   Socioeconomic History  . Marital status: Single    Spouse name: Not on file  . Number of children: Not on file  . Years of education: Not on file  . Highest education level: Not on file  Occupational History  . Not on file  Tobacco Use  . Smoking status: Never Smoker  . Smokeless tobacco: Never Used  Substance and Sexual Activity  . Alcohol use: Yes  . Drug use: No  . Sexual activity: Not on file  Other Topics Concern  . Not on file  Social History Narrative  . Not on file   Social Determinants of Health   Financial Resource Strain: Not on file  Food Insecurity: Not on file  Transportation Needs: Not on file  Physical Activity: Not on file  Stress: Not on file  Social Connections: Not on file  Intimate Partner Violence: Not on file    Family History  Problem Relation Age of Onset  . Supraventricular tachycardia Mother   . Hypercholesterolemia Mother   . Cancer Father   . Hypertension Father   . Congestive Heart Failure Father       There is no immunization history on file for this patient.  Outpatient Encounter Medications as of 10/26/2020  Medication Sig  . HYDROcodone-acetaminophen (NORCO) 5-325 MG tablet Take 1 tablet by mouth every 6 (six) hours as needed for moderate pain.  Marland Kitchen penicillin v potassium (VEETID) 500 MG tablet Take 1 tablet (500 mg total) by mouth 3 (three) times daily.  . SUMAtriptan (IMITREX) 50 MG tablet TAKE 1 TAB BY MOUTH AT ONSET OF HEADACHE, MAY REPEAT 1 TAB IN 1-2 HOURS AS NEEDED  . topiramate (TOPAMAX) 50 MG tablet Take 1 tablet (50 mg total) by mouth daily.  TAKE 1/2 TAB BY MOUTH EVERY EVENING FOR 1 WEEK THEN INCREASE TO 1 TAB BY MOUTH EVERY EVENING   No facility-administered encounter medications on file as of 10/26/2020.     ROS: Gen: no fever, chills  Skin: no rash, itching ENT: no ear pain, ear drainage, nasal congestion, rhinorrhea, sinus pressure, sore throat Eyes: no blurry vision, double vision Resp: no cough, wheeze,SOB Breast: no breast tenderness, no nipple discharge, no breast masses CV: no CP, palpitations, LE edema,  GI: no heartburn, n/v/d/c, abd pain GU: no dysuria, urgency, frequency, hematuria MSK: no joint pain, myalgias, back pain Neuro: no dizziness, headache, weakness, vertigo Psych: no depression, anxiety, insomnia   No Known Allergies  There were no vitals taken for this visit.  Wt Readings from Last 3 Encounters:  11/04/19 230 lb (104.3 kg)  10/28/19 235 lb (106.6 kg)  11/09/17 230 lb (104.3 kg)   Temp Readings from Last 3 Encounters:  11/21/19 97.8 F (36.6 C) (Temporal)  11/04/19 98.6 F (37 C) (Temporal)  10/28/19 98.2 F (36.8 C) (Oral)   BP Readings from Last 3 Encounters:  11/21/19 103/69  11/04/19 119/64  10/28/19 122/63   Pulse Readings from Last 3 Encounters:  11/21/19 79  11/04/19 72  10/28/19 87     Physical Exam Constitutional:  General: She is not in acute distress.    Appearance: She is well-developed and well-nourished.  HENT:     Head: Normocephalic and atraumatic.     Right Ear: Tympanic membrane and ear canal normal.     Left Ear: Tympanic membrane and ear canal normal.     Nose: Nose normal.     Mouth/Throat:     Mouth: Oropharynx is clear and moist and mucous membranes are normal. Mucous membranes are moist.     Pharynx: Oropharynx is clear.  Eyes:     Conjunctiva/sclera: Conjunctivae normal.  Neck:     Thyroid: No thyromegaly.  Cardiovascular:     Rate and Rhythm: Normal rate and regular rhythm.     Pulses: Intact distal pulses.     Heart sounds: Normal  heart sounds. No murmur heard.   Pulmonary:     Effort: Pulmonary effort is normal. No respiratory distress.     Breath sounds: Normal breath sounds. No wheezing or rhonchi.  Abdominal:     General: Bowel sounds are normal. There is no distension.     Palpations: Abdomen is soft. There is no mass.     Tenderness: There is no abdominal tenderness.  Musculoskeletal:        General: No edema.     Cervical back: Neck supple.     Right lower leg: No edema.     Left lower leg: No edema.  Lymphadenopathy:     Cervical: No cervical adenopathy.  Skin:    General: Skin is warm and dry.  Neurological:     Mental Status: She is alert and oriented to person, place, and time.     Motor: No abnormal muscle tone.     Coordination: Coordination normal.  Psychiatric:        Mood and Affect: Mood and affect and mood normal.        Behavior: Behavior normal.      A/P:     This visit occurred during the SARS-CoV-2 public health emergency.  Safety protocols were in place, including screening questions prior to the visit, additional usage of staff PPE, and extensive cleaning of exam room while observing appropriate contact time as indicated for disinfecting solutions.

## 2020-10-27 ENCOUNTER — Other Ambulatory Visit: Payer: Self-pay | Admitting: Family Medicine

## 2020-10-27 ENCOUNTER — Encounter: Payer: Self-pay | Admitting: Family Medicine

## 2020-10-27 DIAGNOSIS — E559 Vitamin D deficiency, unspecified: Secondary | ICD-10-CM

## 2020-10-27 DIAGNOSIS — D509 Iron deficiency anemia, unspecified: Secondary | ICD-10-CM

## 2020-10-27 LAB — HEPATITIS C ANTIBODY
Hepatitis C Ab: NONREACTIVE
SIGNAL TO CUT-OFF: 0.01 (ref <1.00)

## 2020-10-27 MED ORDER — VITAMIN D (ERGOCALCIFEROL) 1.25 MG (50000 UNIT) PO CAPS: 50000.0000 [IU] | ORAL_CAPSULE | ORAL | 4 refills | Status: AC

## 2020-10-28 LAB — IRON,TIBC AND FERRITIN PANEL
%SAT: 4 % (calc) — ABNORMAL LOW (ref 16–45)
Ferritin: 2 ng/mL — ABNORMAL LOW (ref 16–154)
Iron: 18 ug/dL — ABNORMAL LOW (ref 40–190)
TIBC: 471 mcg/dL (calc) — ABNORMAL HIGH (ref 250–450)

## 2020-11-01 ENCOUNTER — Encounter: Payer: Self-pay | Admitting: Family Medicine

## 2021-03-22 ENCOUNTER — Ambulatory Visit: Payer: 59

## 2021-05-26 ENCOUNTER — Encounter (HOSPITAL_COMMUNITY): Payer: Self-pay | Admitting: Emergency Medicine

## 2021-05-26 ENCOUNTER — Other Ambulatory Visit: Payer: Self-pay

## 2021-05-26 ENCOUNTER — Emergency Department (HOSPITAL_COMMUNITY)
Admission: EM | Admit: 2021-05-26 | Discharge: 2021-05-27 | Disposition: A | Discharge status: Home / Self Care | Payer: 59 | Attending: Emergency Medicine | Admitting: Emergency Medicine

## 2021-05-26 DIAGNOSIS — M791 Myalgia, unspecified site: Secondary | ICD-10-CM | POA: Diagnosis not present

## 2021-05-26 DIAGNOSIS — M546 Pain in thoracic spine: Secondary | ICD-10-CM | POA: Diagnosis not present

## 2021-05-26 DIAGNOSIS — Y92410 Unspecified street and highway as the place of occurrence of the external cause: Secondary | ICD-10-CM | POA: Diagnosis not present

## 2021-05-26 DIAGNOSIS — R0789 Other chest pain: Secondary | ICD-10-CM | POA: Insufficient documentation

## 2021-05-26 MED ORDER — METHOCARBAMOL 500 MG PO TABS: 500.0000 mg | ORAL_TABLET | Freq: Two times a day (BID) | ORAL | 0 refills | Status: DC

## 2021-05-26 MED ORDER — KETOROLAC TROMETHAMINE 60 MG/2ML IM SOLN
60.0000 mg | Freq: Once | INTRAMUSCULAR | Status: AC
  Administered 2021-05-26: 60 mg via INTRAMUSCULAR
  Filled 2021-05-26: qty 2

## 2021-05-26 NOTE — Discharge Instructions (Signed)
Take NSAIDs or Tylenol as needed for the next week. Take this medicine with food. Take muscle relaxer at bedtime to help you sleep. This medicine makes you drowsy so do not take before driving or work Use a heating pad for sore muscles - use for 20 minutes several times a day Try gentle range of motion exercises Return for worsening symptoms  
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
## 2021-09-11 ENCOUNTER — Ambulatory Visit (INDEPENDENT_AMBULATORY_CARE_PROVIDER_SITE_OTHER): Payer: 59 | Admitting: Nurse Practitioner

## 2021-09-11 ENCOUNTER — Ambulatory Visit: Payer: Self-pay | Admitting: Nurse Practitioner

## 2021-09-11 ENCOUNTER — Other Ambulatory Visit: Payer: Self-pay

## 2021-09-11 ENCOUNTER — Encounter: Payer: Self-pay | Admitting: Nurse Practitioner

## 2021-09-11 ENCOUNTER — Telehealth: Payer: Self-pay | Admitting: Nurse Practitioner

## 2021-09-11 VITALS — BP 100/58 | HR 89 | Temp 96.1°F | Ht 66.0 in | Wt 230.6 lb

## 2021-09-11 DIAGNOSIS — Z7689 Persons encountering health services in other specified circumstances: Secondary | ICD-10-CM

## 2021-09-11 DIAGNOSIS — E559 Vitamin D deficiency, unspecified: Secondary | ICD-10-CM | POA: Diagnosis not present

## 2021-09-11 DIAGNOSIS — G43909 Migraine, unspecified, not intractable, without status migrainosus: Secondary | ICD-10-CM | POA: Insufficient documentation

## 2021-09-11 DIAGNOSIS — G8929 Other chronic pain: Secondary | ICD-10-CM

## 2021-09-11 DIAGNOSIS — M25511 Pain in right shoulder: Secondary | ICD-10-CM

## 2021-09-11 DIAGNOSIS — G43809 Other migraine, not intractable, without status migrainosus: Secondary | ICD-10-CM | POA: Diagnosis not present

## 2021-09-11 DIAGNOSIS — D509 Iron deficiency anemia, unspecified: Secondary | ICD-10-CM | POA: Insufficient documentation

## 2021-09-11 MED ORDER — MELOXICAM 15 MG PO TABS: 15.0000 mg | ORAL_TABLET | Freq: Every day | ORAL | 1 refills | Status: AC

## 2021-09-11 NOTE — Telephone Encounter (Signed)
Per insurance verification it is a $40 copay for PCP. Did pt not have insurance card?

## 2021-09-11 NOTE — Telephone Encounter (Signed)
Friday ins. Saying she has a $40 copay... I don't believe that is right.
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
## 2021-09-11 NOTE — Assessment & Plan Note (Signed)
Chronic, stable. Takes Imitrex as needed, however she rarely needs to take this. Continue current regimen, follow-up with any concerns.

## 2021-09-11 NOTE — Assessment & Plan Note (Signed)
Chronic, ongoing. She was taking OTC iron supplement, however endorsed constipation even with taking a stool softener. She has not taken her iron supplement in several months. Will have her trial taking the supplement every other day to see if she is able to tolerate this. Will recheck CBC/iron panel next visit. If unable to tolerate PO iron, will place referral to hematology for iron infusions.

## 2021-09-11 NOTE — Telephone Encounter (Signed)
Pt no showed.

## 2021-09-11 NOTE — Patient Instructions (Addendum)
It was great to see you!  I placed a referral for you to continue seeing your chiropractor and a referral to sports medicine.  Start taking meloxicam once a day with food for pain. I have printed some home exercises you can do to help with gentle stretching.  I will look for your MRI results after you have it done tomorrow.   Start taking your iron tablet 65mg  (325 supplemental iron) 3 times a week  Your order for a mammogram was sent to [facility]. When you are ready to schedule please call [phone] to schedule. If you have any questions for [place] please call at [phone].   Let's follow-up in after March 3, sooner if you have concerns.  If a referral was placed today, you will be contacted for an appointment. Please note that routine referrals can sometimes take up to 3-4 weeks to process. Please call our office if you haven't heard anything after this time frame.  Take care,  Vance Peper, NP
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
## 2021-09-11 NOTE — Assessment & Plan Note (Signed)
Chronic, ongoing. Taking 50,000 units vitamin D supplement weekly. Will check vitamin D levels next visit.

## 2021-09-12 NOTE — Telephone Encounter (Signed)
No ma'm

## 2021-09-12 NOTE — Addendum Note (Signed)
Addended by: Rodman Pickle A on: 09/12/2021 07:16 AM   Modules accepted: Orders

## 2021-09-17 ENCOUNTER — Telehealth: Payer: Self-pay | Admitting: Nurse Practitioner

## 2021-09-17 NOTE — Telephone Encounter (Signed)
Called and spoke to pat, she will call back with more information to confirm date of phys exam. Sw, cma

## 2021-09-17 NOTE — Telephone Encounter (Signed)
Pt requesting copy of last CPE (10/26/20).

## 2021-09-18 ENCOUNTER — Ambulatory Visit: Payer: 59 | Admitting: Orthopaedic Surgery

## 2021-09-27 ENCOUNTER — Ambulatory Visit: Payer: 59 | Admitting: Orthopaedic Surgery

## 2021-09-28 ENCOUNTER — Other Ambulatory Visit: Payer: Self-pay | Admitting: Nurse Practitioner

## 2021-09-28 DIAGNOSIS — M25511 Pain in right shoulder: Secondary | ICD-10-CM

## 2021-09-28 DIAGNOSIS — G8929 Other chronic pain: Secondary | ICD-10-CM

## 2021-09-28 NOTE — Progress Notes (Signed)
New orthopedic referral placed. 

## 2021-10-03 ENCOUNTER — Other Ambulatory Visit: Payer: Self-pay

## 2021-10-03 NOTE — Progress Notes (Deleted)
Established Patient Office Visit  Subjective:  Patient ID: Teresa Carpenter, female    DOB: 23-Feb-1981  Age: 41 y.o. MRN: [id number]  CC: No chief complaint on file.   HPI Bennie Pierini presents for ***  Past Medical History:  Diagnosis Date   Anxiety    Migraines     No past surgical history on file.  Family History  Problem Relation Age of Onset   Supraventricular tachycardia Mother    Hypercholesterolemia Mother    Cancer Father    Hypertension Father    Congestive Heart Failure Father     Social History   Socioeconomic History   Marital status: Single    Spouse name: Not on file   Number of children: Not on file   Years of education: Not on file   Highest education level: Not on file  Occupational History   Not on file  Tobacco Use   Smoking status: Never   Smokeless tobacco: Never  Vaping Use   Vaping Use: Never used  Substance and Sexual Activity   Alcohol use: Yes   Drug use: No   Sexual activity: Yes  Other Topics Concern   Not on file  Social History Narrative   Not on file   Social Determinants of Health   Financial Resource Strain: Not on file  Food Insecurity: Not on file  Transportation Needs: Not on file  Physical Activity: Not on file  Stress: Not on file  Social Connections: Not on file  Intimate Partner Violence: Not on file    Outpatient Medications Prior to Visit  Medication Sig Dispense Refill   meloxicam (MOBIC) 15 MG tablet Take 1 tablet (15 mg total) by mouth daily. 30 tablet 1   SUMAtriptan (IMITREX) 50 MG tablet May repeat in 2 hours if headache persists or recurs. 10 tablet 3   Vitamin D, Ergocalciferol, (DRISDOL) 1.25 MG (50000 UNIT) CAPS capsule Take 1 capsule (50,000 Units total) by mouth every 7 (seven) days. 5 capsule 4   No facility-administered medications prior to visit.    No Known Allergies  ROS Review of Systems    Objective:    Physical Exam  There were no vitals taken for this  visit. Wt Readings from Last 3 Encounters:  09/11/21 230 lb 9.6 oz (104.6 kg)  05/26/21 230 lb (104.3 kg)  10/26/20 225 lb 6.4 oz (102.2 kg)     Health Maintenance Due  Topic Date Due   HIV Screening  Never done   PAP SMEAR-Modifier  Never done   COVID-19 Vaccine (3 - Booster for Pfizer series) 08/06/2020   INFLUENZA VACCINE  03/26/2021    There are no preventive care reminders to display for this patient.  No results found for: TSH Lab Results  Component Value Date   WBC 4.8 10/26/2020   HGB 9.0 (L) 10/26/2020   HCT 28.0 (L) 10/26/2020   MCV 67.1 Repeated and verified X2. (L) 10/26/2020   PLT 268.0 10/26/2020   Lab Results  Component Value Date   NA 138 10/26/2020   K 3.8 10/26/2020   CO2 28 10/26/2020   GLUCOSE 92 10/26/2020   BUN 8 10/26/2020   CREATININE 0.70 10/26/2020   AST 12 10/26/2020   ALT 8 10/26/2020   CALCIUM 8.9 10/26/2020   GFR 107.77 10/26/2020   Lab Results  Component Value Date   CHOL 141 10/26/2020   Lab Results  Component Value Date   HDL 40.70 10/26/2020   Lab Results  Component Value  Date   LDLCALC 88 10/26/2020   Lab Results  Component Value Date   TRIG 61.0 10/26/2020   Lab Results  Component Value Date   CHOLHDL 3 10/26/2020   No results found for: HGBA1C    Assessment & Plan:   Problem List Items Addressed This Visit   None   No orders of the defined types were placed in this encounter.   Follow-up: No follow-ups on file.    Charyl Dancer, NP

## 2021-10-04 ENCOUNTER — Encounter: Payer: Self-pay | Admitting: Nurse Practitioner

## 2021-10-05 ENCOUNTER — Encounter: Payer: 59 | Admitting: Nurse Practitioner

## 2021-10-05 ENCOUNTER — Telehealth: Payer: Self-pay | Admitting: Nurse Practitioner

## 2021-10-05 NOTE — Telephone Encounter (Signed)
Pt did not show for her 10/05/21 appointment with Leotis Shames, I sent her a First No Show letter.

## 2021-10-07 NOTE — Telephone Encounter (Signed)
Noted  

## 2021-10-07 NOTE — Telephone Encounter (Signed)
Same day cancellation 10/04/21 w/Lauren No show 10/05/21 w/Lauren $50 fee generated  Almira Coaster mailed out no show letter. Additional no show may lead to dismissal.

## 2021-10-10 ENCOUNTER — Ambulatory Visit (INDEPENDENT_AMBULATORY_CARE_PROVIDER_SITE_OTHER): Payer: 59 | Admitting: Orthopaedic Surgery

## 2021-10-10 ENCOUNTER — Other Ambulatory Visit: Payer: Self-pay

## 2021-10-10 ENCOUNTER — Ambulatory Visit (INDEPENDENT_AMBULATORY_CARE_PROVIDER_SITE_OTHER): Payer: 59

## 2021-10-10 DIAGNOSIS — M25511 Pain in right shoulder: Secondary | ICD-10-CM

## 2021-10-10 DIAGNOSIS — G8929 Other chronic pain: Secondary | ICD-10-CM

## 2021-10-10 MED ORDER — CELECOXIB 200 MG PO CAPS: 200.0000 mg | ORAL_CAPSULE | Freq: Two times a day (BID) | ORAL | 1 refills | Status: AC | PRN

## 2021-10-10 MED ORDER — LIDOCAINE HCL 1 % IJ SOLN
3.0000 mL | INTRAMUSCULAR | Status: AC | PRN
  Administered 2021-10-10: 3 mL

## 2021-10-10 MED ORDER — METHYLPREDNISOLONE ACETATE 40 MG/ML IJ SUSP
40.0000 mg | INTRAMUSCULAR | Status: AC | PRN
  Administered 2021-10-10: 40 mg via INTRA_ARTICULAR
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
## 2021-10-11 ENCOUNTER — Other Ambulatory Visit: Payer: Self-pay

## 2021-10-11 DIAGNOSIS — M25511 Pain in right shoulder: Secondary | ICD-10-CM

## 2021-10-11 DIAGNOSIS — G8929 Other chronic pain: Secondary | ICD-10-CM

## 2021-10-17 IMAGING — DX DG CHEST 2V
2 series · 2 of 2 positions shown · non-contrast
Comparison: No priors.

CLINICAL DATA: 38-year-old female with history of persistent cough.
Diagnosed with [id number] on 10/05/2019.

EXAM:
CHEST - 2 VIEW

[chest pa]
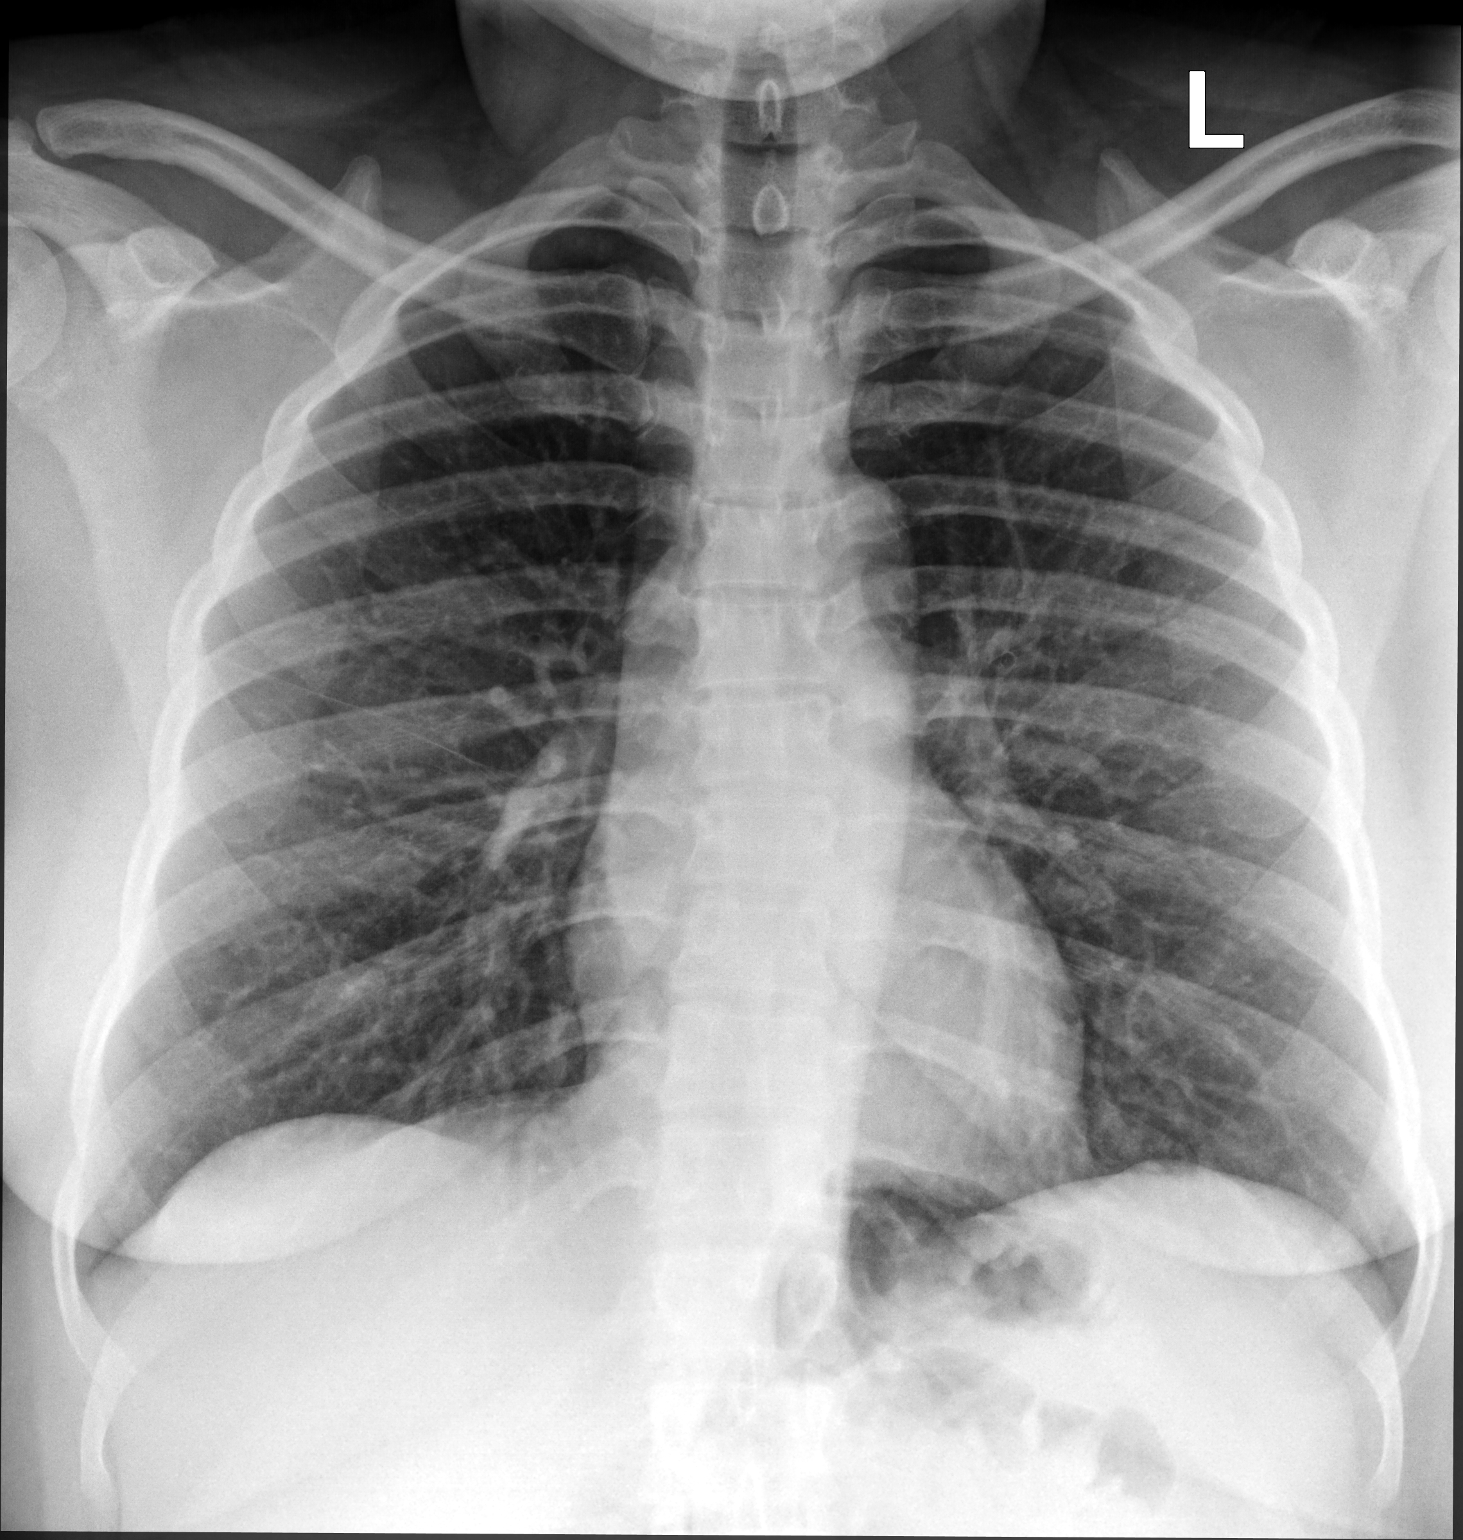

[chest lat]
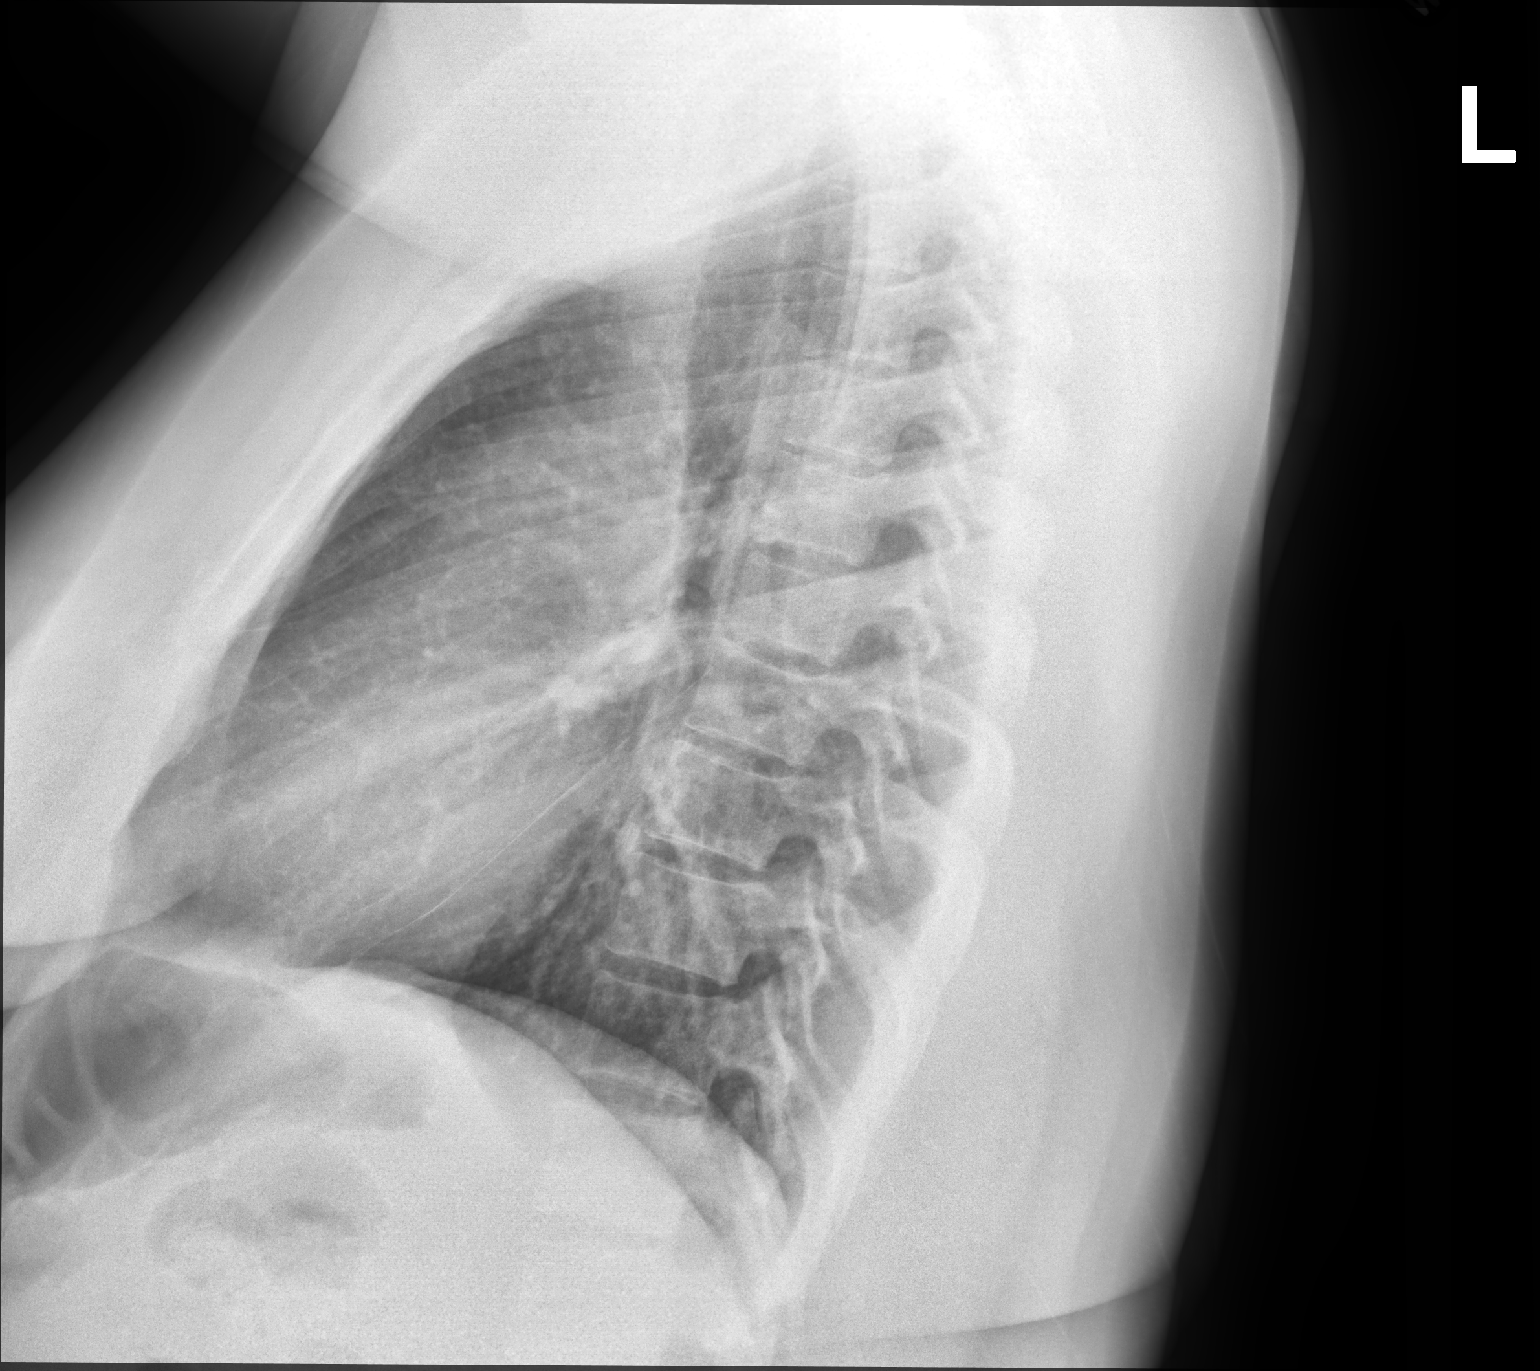

[2 of 2 positions shown; findings below may reference images not displayed]

FINDINGS: Lung volumes are normal. No consolidative airspace disease. No
pleural effusions. No pneumothorax. No pulmonary nodule or mass
noted. Pulmonary vasculature and the cardiomediastinal silhouette
are within normal limits.
IMPRESSION: No radiographic evidence of acute cardiopulmonary disease.

## 2021-10-31 ENCOUNTER — Ambulatory Visit: Payer: 59 | Admitting: Orthopaedic Surgery

## 2021-11-20 ENCOUNTER — Other Ambulatory Visit: Payer: Self-pay

## 2021-11-20 DIAGNOSIS — E559 Vitamin D deficiency, unspecified: Secondary | ICD-10-CM

## 2021-11-20 NOTE — Telephone Encounter (Signed)
Called and ldvm. Sw,cma ?

## 2021-12-14 ENCOUNTER — Telehealth: Payer: Self-pay

## 2021-12-14 NOTE — Telephone Encounter (Signed)
Pt called triage about getting in for the other injection in shoulder with Dr. Alvester Morin. Can you please cb to get this scheduled. Thank you ?

## 2022-01-14 ENCOUNTER — Ambulatory Visit (INDEPENDENT_AMBULATORY_CARE_PROVIDER_SITE_OTHER): Payer: 59 | Admitting: Physical Medicine and Rehabilitation

## 2022-01-14 ENCOUNTER — Ambulatory Visit: Payer: Self-pay

## 2022-01-14 ENCOUNTER — Encounter: Payer: Self-pay | Admitting: Physical Medicine and Rehabilitation

## 2022-01-14 DIAGNOSIS — M25511 Pain in right shoulder: Secondary | ICD-10-CM | POA: Diagnosis not present

## 2022-01-14 DIAGNOSIS — G8929 Other chronic pain: Secondary | ICD-10-CM | POA: Diagnosis not present

## 2022-01-14 NOTE — Progress Notes (Signed)
Pt state right shoulder pain. Pt state any movement with her right arm makes the pain worse. Pt state she takes over the counter pain meds to help ease her pain.  Numeric Pain Rating Scale and Functional Assessment Average Pain 6   In the last MONTH (on 0-10 scale) has pain interfered with the following?  1. General activity like being  able to carry out your everyday physical activities such as walking, climbing stairs, carrying groceries, or moving a chair?  Rating(9)   -BT, -Dye Allergies.

## 2022-01-23 MED ORDER — BUPIVACAINE HCL 0.25 % IJ SOLN
5.0000 mL | INTRAMUSCULAR | Status: AC | PRN
  Administered 2022-01-14: 5 mL via INTRA_ARTICULAR

## 2022-01-23 MED ORDER — TRIAMCINOLONE ACETONIDE 40 MG/ML IJ SUSP
40.0000 mg | INTRAMUSCULAR | Status: AC | PRN
  Administered 2022-01-14: 40 mg via INTRA_ARTICULAR

## 2022-01-23 NOTE — Progress Notes (Signed)
   Teresa Carpenter - 41 y.o. female MRN [id number]  Date of birth: [birth year]  Office Visit Note: Visit Date: 01/14/2022 PCP: No primary care provider on file. Referred by: Kathryne Hitch*  Subjective: Chief Complaint  Patient presents with   Right Shoulder - Pain   HPI:  Teresa Carpenter is a 41 y.o. female who comes in today at the request of Dr. Doneen Poisson for planned Right anesthetic glenohumeral arthrogram with fluoroscopic guidance.  The patient has failed conservative care including home exercise, medications, time and activity modification.  This injection will be diagnostic and hopefully therapeutic.  Please see requesting physician notes for further details and justification.  ROS Otherwise per HPI.  Assessment & Plan: Visit Diagnoses:    ICD-10-CM   1. Chronic right shoulder pain  M25.511 XR C-ARM NO REPORT   G89.29       Plan: No additional findings.   Meds & Orders: No orders of the defined types were placed in this encounter.   Orders Placed This Encounter  Procedures   Large Joint Inj   XR C-ARM NO REPORT    Follow-up: Return if symptoms worsen or fail to improve.   Procedures: Large Joint Inj: R glenohumeral on 01/14/2022 3:15 PM Indications: pain and diagnostic evaluation Details: 22 G 3.5 in needle, fluoroscopy-guided anteromedial approach  Arthrogram: No  Medications: 40 mg triamcinolone acetonide 40 MG/ML; 5 mL bupivacaine 0.25 % Outcome: tolerated well, no immediate complications  There was excellent flow of contrast producing a partial arthrogram of the glenohumeral joint. The patient did have relief of symptoms during the anesthetic phase of the injection. Procedure, treatment alternatives, risks and benefits explained, specific risks discussed. Consent was given by the patient. Immediately prior to procedure a time out was called to verify the correct patient, procedure, equipment, support staff and site/side marked as  required. Patient was prepped and draped in the usual sterile fashion.         Clinical History: No specialty comments available.     Objective:  VS:  HT:    WT:   BMI:     BP:   HR: bpm  TEMP: ( )  RESP:  Physical Exam   Imaging: No results found.

## 2022-03-04 ENCOUNTER — Ambulatory Visit: Payer: 59 | Admitting: Orthopaedic Surgery

## 2022-03-04 DIAGNOSIS — M25511 Pain in right shoulder: Secondary | ICD-10-CM

## 2022-03-04 DIAGNOSIS — G8929 Other chronic pain: Secondary | ICD-10-CM | POA: Diagnosis not present
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
## 2022-04-22 NOTE — Telephone Encounter (Signed)
Na
